# Patient Record
Sex: Male | Born: 1980 | Race: Black or African American | Hispanic: No | Marital: Married | Smoking: Current every day smoker
Health system: Southern US, Community
[De-identification: ages and names within clinical notes are randomized; demographics above are authoritative.]

---

## 2010-08-17 ENCOUNTER — Emergency Department (HOSPITAL_COMMUNITY)
Admission: EM | Admit: 2010-08-17 | Discharge: 2010-08-17 | Payer: Self-pay | Source: Home / Self Care | Admitting: Emergency Medicine

## 2010-08-17 ENCOUNTER — Emergency Department (HOSPITAL_COMMUNITY)
Admission: EM | Admit: 2010-08-17 | Discharge: 2010-08-18 | Payer: Self-pay | Source: Home / Self Care | Admitting: Emergency Medicine

## 2010-11-03 LAB — DIFFERENTIAL
Basophils Absolute: 0 10*3/uL (ref 0.0–0.1)
Eosinophils Relative: 3 % (ref 0–5)
Lymphs Abs: 1.4 10*3/uL (ref 0.7–4.0)
Monocytes Absolute: 0.9 10*3/uL (ref 0.1–1.0)
Neutro Abs: 3.9 10*3/uL (ref 1.7–7.7)

## 2010-11-03 LAB — BASIC METABOLIC PANEL
BUN: 9 mg/dL (ref 6–23)
CO2: 26 mEq/L (ref 19–32)
GFR calc Af Amer: >60 mL/min (ref >60)
Glucose, Bld: 100 mg/dL — ABNORMAL HIGH (ref 70–99)
Sodium: 136 mEq/L (ref 135–145)

## 2010-11-03 LAB — CBC
Hemoglobin: 13.7 g/dL (ref 13.0–17.0)
MCH: 33.2 pg (ref 26.0–34.0)
Platelets: 203 10*3/uL (ref 150–400)
RDW: 12 % (ref 11.5–15.5)
WBC: 6.4 10*3/uL (ref 4.0–10.5)

## 2011-08-10 ENCOUNTER — Emergency Department (HOSPITAL_COMMUNITY)
Admission: EM | Admit: 2011-08-10 | Discharge: 2011-08-10 | Disposition: A | Discharge status: Home / Self Care | Payer: Self-pay | Attending: Emergency Medicine | Admitting: Emergency Medicine

## 2011-08-10 ENCOUNTER — Encounter: Payer: Self-pay | Admitting: *Deleted

## 2011-08-10 ENCOUNTER — Emergency Department (HOSPITAL_COMMUNITY): Payer: Self-pay

## 2011-08-10 DIAGNOSIS — R509 Fever, unspecified: Secondary | ICD-10-CM | POA: Insufficient documentation

## 2011-08-10 DIAGNOSIS — M545 Low back pain, unspecified: Secondary | ICD-10-CM | POA: Insufficient documentation

## 2011-08-10 DIAGNOSIS — R51 Headache: Secondary | ICD-10-CM | POA: Insufficient documentation

## 2011-08-10 DIAGNOSIS — IMO0001 Reserved for inherently not codable concepts without codable children: Secondary | ICD-10-CM | POA: Insufficient documentation

## 2011-08-10 DIAGNOSIS — R6883 Chills (without fever): Secondary | ICD-10-CM | POA: Insufficient documentation

## 2011-08-10 DIAGNOSIS — J111 Influenza due to unidentified influenza virus with other respiratory manifestations: Secondary | ICD-10-CM | POA: Insufficient documentation

## 2011-08-10 DIAGNOSIS — R05 Cough: Secondary | ICD-10-CM | POA: Insufficient documentation

## 2011-08-10 DIAGNOSIS — H5789 Other specified disorders of eye and adnexa: Secondary | ICD-10-CM | POA: Insufficient documentation

## 2011-08-10 DIAGNOSIS — R059 Cough, unspecified: Secondary | ICD-10-CM | POA: Insufficient documentation

## 2011-08-10 DIAGNOSIS — M542 Cervicalgia: Secondary | ICD-10-CM | POA: Insufficient documentation

## 2011-08-10 MED ORDER — NAPROXEN 500 MG PO TABS: 500.0000 mg | ORAL_TABLET | Freq: Two times a day (BID) | ORAL | Status: AC

## 2011-08-10 NOTE — ED Notes (Signed)
Repots body aches, cough, chills, headache.  States symptoms started about 3 days ago.  Reports taking dayquil and nyquil for symptom relief.

## 2011-08-10 NOTE — ED Notes (Signed)
Received report agree with previous assessment 

## 2011-08-10 NOTE — ED Provider Notes (Signed)
This chart was scribed for Alan Jakes, MD by Wallis Mart. The patient was seen in room APA14/APA14 and the patient's care was started at 10:20 PM.    CSN: [id number] Arrival date & time: 08/10/2011  8:59 PM   First MD Initiated Contact with Patient 08/10/11 2105      Chief Complaint  Patient presents with  . Cough    (Consider location/radiation/quality/duration/timing/severity/associated sxs/prior treatment) Patient is a 30 y.o. male presenting with cough.  Cough This is a new problem. The current episode started more than 2 days ago. The problem occurs constantly. The problem has been gradually worsening. The cough is non-productive. Associated symptoms include chills, headaches, myalgias and eye redness. Pertinent negatives include no chest pain, no ear pain, no sore throat and no shortness of breath. He has tried cough syrup for the symptoms. The treatment provided no relief.   Pt seen at 10:20 PM SHAHIR KAREN is a 30 y.o. male who presents to the Emergency Department complaining of sudden onset, gradually worsening non-productive cough that began 3 days ago.  Pt c/o associated body aches, chills, headache, neck pain, lower back pain.  Pt says he woke up 5-6 hours ago and his body was in pain. Pt is positive for sick contact at home.  Pt self medicated with Nyquil and dayquil with no relief of symptoms.   Pt Denies n/v/d, sore throat, rash, dysuria, swelling in ankles.  History reviewed. No pertinent past medical history.  History reviewed. No pertinent past surgical history.  History reviewed. No pertinent family history.  History  Substance Use Topics  . Smoking status: Current Everyday Smoker -- 0.5 packs/day  . Smokeless tobacco: Not on file  . Alcohol Use: No      Review of Systems  Constitutional: Positive for chills.  HENT: Positive for neck pain. Negative for ear pain and sore throat.   Eyes: Positive for redness.  Respiratory: Positive for cough.  Negative for shortness of breath.   Cardiovascular: Negative for chest pain.  Musculoskeletal: Positive for myalgias and back pain.  Neurological: Positive for headaches.   10 Systems reviewed and are negative for acute change except as noted in the HPI.  Allergies  Amoxicillin  Home Medications   Current Outpatient Rx  Name Route Sig Dispense Refill  . NAPROXEN 500 MG PO TABS Oral Take 1 tablet (500 mg total) by mouth 2 (two) times daily. 14 tablet 0    BP 111/60  Pulse 93  Temp(Src) 99.1 F (37.3 C) (Oral)  Resp 24  Ht 5\' 7"  (1.702 m)  Wt 190 lb (86.183 kg)  BMI 29.76 kg/m2  SpO2 99%  Physical Exam  Nursing note and vitals reviewed. Constitutional: He is oriented to person, place, and time. He appears well-developed and well-nourished. No distress.  HENT:  Head: Normocephalic and atraumatic.       White coating on tongue  Eyes: EOM are normal. Pupils are equal, round, and reactive to light.       Redness in eyes (conjunctivits)  Neck: Neck supple. No tracheal deviation present.  Cardiovascular: Normal rate and normal heart sounds.   Pulmonary/Chest: Effort normal and breath sounds normal. No respiratory distress.  Abdominal: Soft. Bowel sounds are normal. He exhibits no distension. There is no tenderness.  Musculoskeletal: Normal range of motion. He exhibits no edema.  Neurological: He is alert and oriented to person, place, and time. No sensory deficit.  Skin: Skin is warm and dry.  Psychiatric: He has a normal  mood and affect. His behavior is normal.    ED Course  Procedures (including critical care time) DIAGNOSTIC STUDIES: Oxygen Saturation is 99% on room air, normal by my interpretation.    COORDINATION OF CARE:    Labs Reviewed - No data to display Dg Chest 2 View  08/10/2011  *RADIOLOGY REPORT*  Clinical Data: Cough, fever.  CHEST - 2 VIEW  Comparison: None.  Findings: Lungs are clear. No pleural effusion or pneumothorax. The cardiomediastinal  contours are within normal limits. The visualized bones and soft tissues are without significant appreciable abnormality.  IMPRESSION: No acute cardiopulmonary process.  Original Report Authenticated By: Waneta Martins, M.D.     1. Influenza       MDM  Patient's chest x-ray negative for pneumonia symptom complex is consistent with influenza she's been symptomatic for 3 days now. Patient is nontoxic in no acute distress will treat symptomatically.    I personally performed the services described in this documentation, which was scribed in my presence. The recorded information has been reviewed and considered.         Alan Jakes, MD 08/10/11 [phone]

## 2011-08-10 NOTE — ED Notes (Signed)
Dry cough, chills, body aches for 3 days. No NVD.

## 2011-08-10 NOTE — ED Notes (Signed)
Patient transported to X-ray 

## 2014-08-24 HISTORY — PX: HAND SURGERY: SHX662

## 2016-07-24 ENCOUNTER — Other Ambulatory Visit: Payer: Self-pay | Admitting: Occupational Medicine

## 2016-07-24 ENCOUNTER — Ambulatory Visit: Payer: Self-pay

## 2016-07-24 DIAGNOSIS — M79644 Pain in right finger(s): Secondary | ICD-10-CM

## 2016-07-29 ENCOUNTER — Encounter (HOSPITAL_COMMUNITY): Payer: Self-pay | Admitting: *Deleted

## 2016-07-29 ENCOUNTER — Emergency Department (HOSPITAL_COMMUNITY)
Admission: EM | Admit: 2016-07-29 | Discharge: 2016-07-29 | Disposition: A | Discharge status: Home / Self Care | Payer: Worker's Compensation | Attending: Emergency Medicine | Admitting: Emergency Medicine

## 2016-07-29 DIAGNOSIS — Y9289 Other specified places as the place of occurrence of the external cause: Secondary | ICD-10-CM | POA: Insufficient documentation

## 2016-07-29 DIAGNOSIS — Y9389 Activity, other specified: Secondary | ICD-10-CM | POA: Insufficient documentation

## 2016-07-29 DIAGNOSIS — Y990 Civilian activity done for income or pay: Secondary | ICD-10-CM | POA: Insufficient documentation

## 2016-07-29 DIAGNOSIS — F17200 Nicotine dependence, unspecified, uncomplicated: Secondary | ICD-10-CM | POA: Diagnosis not present

## 2016-07-29 DIAGNOSIS — S61213A Laceration without foreign body of left middle finger without damage to nail, initial encounter: Secondary | ICD-10-CM

## 2016-07-29 DIAGNOSIS — W3189XA Contact with other specified machinery, initial encounter: Secondary | ICD-10-CM | POA: Insufficient documentation

## 2016-07-29 NOTE — Progress Notes (Signed)
Orthopedic Tech Progress Note Patient Details:  Alan James [birth year] [id number]  Ortho Devices Type of Ortho Device: Finger splint Ortho Device/Splint Location: LUE 3 RD finger Ortho Device/Splint Interventions: Ordered, Application   Jennye MoccasinHughes, Jafar Poffenberger Craig 07/29/2016, 5:27 PM

## 2016-07-29 NOTE — ED Provider Notes (Signed)
[facility]-EMERGENCY DEPT Provider Note   CSN: [id number] Arrival date & time: 07/29/16  1552  By signing my name below, I, Alan James, attest that this documentation has been prepared under the direction and in the presence of Alan BuffaloHope Neese, NP. Electronically Signed: Angelene GiovanniEmmanuella James, ED Scribe. 07/29/16. 4:52 PM.   History   Chief Complaint Chief Complaint  Patient presents with  . Finger Injury    HPI Comments: Alan James is a 35 y.o. male who presents to the Emergency Department complaining of a laceration to his left middle finger that occurred PTA while at work. He explains that he was working on a machine when his finger slipped and he lacerated his finger, sustaining an avulsion. Bleeding is currently controlled with an applied gauze and quick clot [facility] at [facility]. He states that [facility], he was advised to come here for further evaluation. No oral medications noted. Pt has an allergy to Amoxicillin. He reports that his last tetanus vaccine was in 2012. He denies any fever, chills, vomiting, numbness, weakness, or any other symptoms.   The history is provided by the patient. No language interpreter was used.    History reviewed. No pertinent past medical history.  There are no active problems to display for this patient.   History reviewed. No pertinent surgical history.     Home Medications    Prior to Admission medications   Not on File    Family History History reviewed. No pertinent family history.  Social History Social History  Substance Use Topics  . Smoking status: Current Every Day Smoker    Packs/day: 0.50  . Smokeless tobacco: Not on file  . Alcohol use No     Allergies   Amoxicillin   Review of Systems Review of Systems  Constitutional: Negative for chills and fever.  Gastrointestinal: Negative for vomiting.  Skin: Positive for wound.  Neurological: Negative for  weakness and numbness.    Physical Exam Updated Vital Signs BP 133/81 (BP Location: Right Arm)   Pulse 77   Temp 98.2 F (36.8 C) (Oral)   Resp 18   SpO2 98%   Physical Exam  Constitutional: He is oriented to person, place, and time. He appears well-developed and well-nourished. No distress.  HENT:  Head: Normocephalic and atraumatic.  Eyes: Conjunctivae and EOM are normal.  Neck: Neck supple. No tracheal deviation present.  Cardiovascular: Normal rate.   Pulmonary/Chest: Effort normal. No respiratory distress.  Musculoskeletal: Normal range of motion.  Wound to left middle finger, ulnar aspect that is an avulsion; bleeding has stopped after quick clot [facility]  Neurological: He is alert and oriented to person, place, and time.  Skin: Skin is warm and dry.  Psychiatric: He has a normal mood and affect. His behavior is normal.  Nursing note and vitals reviewed.    ED Treatments / Results  DIAGNOSTIC STUDIES: Oxygen Saturation is 98% on RA, normal by my interpretation.    COORDINATION OF CARE: 4:47 PM- Pt advised of plan for treatment and pt agrees. Pt will receive dressing.    Labs (all labs ordered are listed, but only abnormal results are displayed) Labs Reviewed - No data to display   Radiology No results found.  Procedures Procedures (including critical care time)  Medications Ordered in ED Medications - No data to display   Initial Impression / Assessment and Plan / ED Course  Alan BuffaloHope Neese, NP has  reviewed the triage vital signs and the nursing notes.  Clinical Course     Tetanus vaccine UTD. Pt has no co morbidities to effect normal wound healing. Discussed wound care  with pt and answered questions. Pt to f-u for any signs of infection or if bleeding starts back and he can not stop it. Nonstick dressing and splint applied.  Pt is hemodynamically stable w no complaints prior to dc.    Final Clinical Impressions(s) / ED Diagnoses    Final diagnoses:  Laceration of left middle finger without foreign body without damage to nail, initial encounter    New Prescriptions New Prescriptions   No medications on file   I personally performed the services described in this documentation, which was scribed in my presence. The recorded information has been reviewed and is accurate.    Alan ParkHope M Neese, NP 07/29/16 1710    Alan ConnPedro Eduardo Cardama, MD 07/30/16 [phone]

## 2016-07-29 NOTE — ED Notes (Signed)
Papers reviewed and splint applied by ortho tech

## 2016-07-29 NOTE — ED Triage Notes (Signed)
Pt reports injuring left middle finger at work. Went to [facility] but was sent here for further eval. Has laceration noted to lateral finger but bleeding is controlled.

## 2016-07-29 NOTE — Discharge Instructions (Signed)
Wear a dressing and splint while working. You can leave the wound open at home. If you develop signs of infection, redness,swelling, increased pain, red streaking or bleeding starts back and you can't get it stopped return here.

## 2018-06-02 ENCOUNTER — Emergency Department
Admission: EM | Admit: 2018-06-02 | Discharge: 2018-06-02 | Disposition: A | Discharge status: Home / Self Care | Payer: Self-pay | Attending: Emergency Medicine | Admitting: Emergency Medicine

## 2018-06-02 DIAGNOSIS — Y929 Unspecified place or not applicable: Secondary | ICD-10-CM | POA: Insufficient documentation

## 2018-06-02 DIAGNOSIS — S46911A Strain of unspecified muscle, fascia and tendon at shoulder and upper arm level, right arm, initial encounter: Secondary | ICD-10-CM | POA: Insufficient documentation

## 2018-06-02 DIAGNOSIS — F17200 Nicotine dependence, unspecified, uncomplicated: Secondary | ICD-10-CM | POA: Insufficient documentation

## 2018-06-02 DIAGNOSIS — X500XXA Overexertion from strenuous movement or load, initial encounter: Secondary | ICD-10-CM | POA: Insufficient documentation

## 2018-06-02 DIAGNOSIS — Y990 Civilian activity done for income or pay: Secondary | ICD-10-CM | POA: Insufficient documentation

## 2018-06-02 DIAGNOSIS — Y93E6 Activity, residential relocation: Secondary | ICD-10-CM | POA: Insufficient documentation

## 2018-06-02 DIAGNOSIS — S46811A Strain of other muscles, fascia and tendons at shoulder and upper arm level, right arm, initial encounter: Secondary | ICD-10-CM | POA: Insufficient documentation

## 2018-06-02 MED ORDER — KETOROLAC TROMETHAMINE 30 MG/ML IJ SOLN
30.0000 mg | Freq: Once | INTRAMUSCULAR | Status: AC
  Administered 2018-06-02: 30 mg via INTRAMUSCULAR
  Filled 2018-06-02: qty 1

## 2018-06-02 MED ORDER — MELOXICAM 15 MG PO TABS: 15.0000 mg | ORAL_TABLET | Freq: Every day | ORAL | 0 refills | Status: DC

## 2018-06-02 NOTE — ED Provider Notes (Signed)
[facility] Emergency Department Provider Note  ____________________________________________  Time seen: Approximately 10:11 AM  I have reviewed the triage vital signs and the nursing notes.   HISTORY  Chief Complaint Shoulder Pain    HPI Alan James is a 37 y.o. male who presents the emergency department complaining of right shoulder pain.  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  Patient reports that he woke up with an ache in the right posterior and anterior shoulder.  Patient denies any specific injury yesterday.  He denies any radicular symptoms in the right upper extremity.  Patient denies any neck pain or chest pain.  Patient is not tried medications for this complaint prior to arrival.  No other complaints at this time.    History reviewed. No pertinent past medical history.  There are no active problems to display for this patient.   History reviewed. No pertinent surgical history.  Prior to Admission medications   Medication Sig Start Date End Date Taking? Authorizing Provider  meloxicam (MOBIC) 15 MG tablet Take 1 tablet (15 mg total) by mouth daily. 06/02/18   Jeilyn Reznik, Delorise Royals, PA-C    Allergies Amoxicillin  No family history on file.  Social History Social History   Tobacco Use  . Smoking status: Current Every Day Smoker    Packs/day: 0.50  . Smokeless tobacco: Never Used  Substance Use Topics  . Alcohol use: Yes    Comment: ocassional  . Drug use: Not on file     Review of Systems  Constitutional: No fever/chills Eyes: No visual changes. ENT: No upper respiratory complaints. Cardiovascular: no chest pain. Respiratory: no cough. No SOB. Gastrointestinal: No abdominal pain.  No nausea, no vomiting.   Musculoskeletal: Positive for right shoulder pain Skin: Negative for rash, abrasions, lacerations, ecchymosis. Neurological: Negative for headaches, focal  weakness or numbness. 10-point ROS otherwise negative.  ____________________________________________   PHYSICAL EXAM:  VITAL SIGNS: ED Triage Vitals  Enc Vitals Group     BP 06/02/18 1000 123/76     Pulse Rate 06/02/18 1000 62     Resp 06/02/18 1000 16     Temp 06/02/18 1000 98.1 F (36.7 C)     Temp Source 06/02/18 1000 Oral     SpO2 06/02/18 1000 98 %     Weight 06/02/18 0958 190 lb (86.2 kg)     Height 06/02/18 0958 5\' 7"  (1.702 m)     Head Circumference --      Peak Flow --      Pain Score 06/02/18 0958 7     Pain Loc --      Pain Edu? --      Excl. in GC? --      Constitutional: Alert and oriented. Well appearing and in no acute distress. Eyes: Conjunctivae are normal. PERRL. EOMI. Head: Atraumatic. Neck: No stridor.  No cervical spine tenderness to palpation.  Cardiovascular: Normal rate, regular rhythm. Normal S1 and S2.  Good peripheral circulation. Respiratory: Normal respiratory effort without tachypnea or retractions. Lungs CTAB. Good air entry to the bases with no decreased or absent breath sounds. Musculoskeletal: Full range of motion to all extremities. No gross deformities appreciated.  Visualization of the right shoulder reveals no ecchymosis, deformity, edema.  Patient has full range of motion.  Negative Neer's.  Negative drop test.  Patient is tender to palpation along the trapezius muscle as well as the posterior rotator cuff and acromioclavicular joint space.  No palpable abnormality or deficit.  No other tenderness to palpation.  Radial pulse intact distally.  Sensation intact x5 digits distally. Neurologic:  Normal speech and language. No gross focal neurologic deficits are appreciated.  Skin:  Skin is warm, dry and intact. No rash noted. Psychiatric: Mood and affect are normal. Speech and behavior are normal. Patient exhibits appropriate insight and judgement.   ____________________________________________   LABS (all labs ordered are listed, but  only abnormal results are displayed)  Labs Reviewed - No data to display ____________________________________________  EKG   ____________________________________________  RADIOLOGY   No results found.  ____________________________________________    PROCEDURES  Procedure(s) performed:    Procedures    Medications  ketorolac (TORADOL) 30 MG/ML injection 30 mg (has no administration in time range)     ____________________________________________   INITIAL IMPRESSION / ASSESSMENT AND PLAN / ED COURSE  Pertinent labs & imaging results that were available during my care of the patient were reviewed by me and considered in my medical decision making (see chart for details).  Review of the [place] CSRS was performed in accordance of the [facility] prior to dispensing any controlled drugs.      Patient's diagnosis is consistent with shoulder strain, trapezius muscle strain.  Patient presents the emergency department with shoulder pain after lifting increased weight yesterday for his job.  No indication of acute muscle tear or ligament rupture.  No indication for imaging at this time.  Patient was given Toradol injection in the emergency department for symptom relief. Patient will be discharged home with prescriptions for meloxicam. Patient is to follow up with primary care or orthopedics as needed or otherwise directed. Patient is given ED precautions to return to the ED for any worsening or new symptoms.     ____________________________________________  FINAL CLINICAL IMPRESSION(S) / ED DIAGNOSES  Final diagnoses:  Strain of right shoulder, initial encounter  Strain of right trapezius muscle, initial encounter      NEW MEDICATIONS STARTED DURING THIS VISIT:  ED Discharge Orders         Ordered    meloxicam (MOBIC) 15 MG tablet  Daily     06/02/18 1032              This chart was dictated using voice recognition software/Dragon. Despite best efforts to  proofread, errors can occur which can change the meaning. Any change was purely unintentional.    Racheal Patches, PA-C 06/02/18 1033    Dionne Bucy, MD 06/02/18 [phone]

## 2018-06-02 NOTE — ED Notes (Signed)
See triage note  Presents with pain to right shoulder  States pain radiates from neck into shoulder no deformity noted  Denies any specific injury but does do a lot of heavy lifting  Good pulses

## 2018-06-02 NOTE — ED Triage Notes (Signed)
Pt came to ED via pov c/o right shoulder pain. Pt reports did a lot of heavy lifting yesterday and woke up today with pain that radiates up towards neck

## 2018-09-04 ENCOUNTER — Other Ambulatory Visit: Payer: Self-pay

## 2018-09-04 ENCOUNTER — Emergency Department
Admission: EM | Admit: 2018-09-04 | Discharge: 2018-09-04 | Disposition: A | Discharge status: Home / Self Care | Payer: Self-pay | Attending: Emergency Medicine | Admitting: Emergency Medicine

## 2018-09-04 DIAGNOSIS — K047 Periapical abscess without sinus: Secondary | ICD-10-CM | POA: Insufficient documentation

## 2018-09-04 DIAGNOSIS — Z79899 Other long term (current) drug therapy: Secondary | ICD-10-CM | POA: Insufficient documentation

## 2018-09-04 DIAGNOSIS — F17210 Nicotine dependence, cigarettes, uncomplicated: Secondary | ICD-10-CM | POA: Insufficient documentation

## 2018-09-04 MED ORDER — OXYCODONE-ACETAMINOPHEN 5-325 MG PO TABS
1.0000 | ORAL_TABLET | Freq: Once | ORAL | Status: AC
  Administered 2018-09-04: 1 via ORAL
  Filled 2018-09-04: qty 1

## 2018-09-04 MED ORDER — CLINDAMYCIN PHOSPHATE 600 MG/4ML IJ SOLN
600.0000 mg | Freq: Once | INTRAMUSCULAR | Status: AC
  Administered 2018-09-04: 600 mg via INTRAMUSCULAR
  Filled 2018-09-04: qty 4

## 2018-09-04 MED ORDER — LIDOCAINE VISCOUS HCL 2 % MT SOLN: 10.0000 mL | OROMUCOSAL | 0 refills | Status: DC | PRN

## 2018-09-04 MED ORDER — CLINDAMYCIN HCL 300 MG PO CAPS: 300.0000 mg | ORAL_CAPSULE | Freq: Three times a day (TID) | ORAL | 0 refills | Status: AC

## 2018-09-04 MED ORDER — DEXAMETHASONE SODIUM PHOSPHATE 10 MG/ML IJ SOLN
10.0000 mg | Freq: Once | INTRAMUSCULAR | Status: AC
  Administered 2018-09-04: 10 mg via INTRAMUSCULAR
  Filled 2018-09-04: qty 1

## 2018-09-04 NOTE — Discharge Instructions (Signed)
OPTIONS FOR DENTAL FOLLOW UP CARE ° °[place] Department of Health and Human Services - Local Safety Net Dental Clinics °http://www.ncdhhs.gov/dph/oralhealth/services/safetynetclinics.htm °  °[facility] (336-562-3123) ° °[facility] [place] (919-933-9087) ° °[facility] ([phone] ext [phone]) ° °[facility] ([phone]) ° °[facility] ([phone]) °This clinic caters to the indigent population and is on a lottery system. °Location: °[facility], [address], [place] °Clinic Hours: °Wednesdays from 6pm - 9pm, patients seen by a lottery system. °For dates, call or go to www.med.[facility].edu/[facility]/patients/Dental-[facility] °Services: °Cleanings, fillings and simple extractions. °Payment Options: °DENTAL WORK IS FREE OF CHARGE. Bring proof of income or support. °Best way to get seen: °Arrive at 5:15 pm - this is a lottery, NOT first come/first serve, so arriving earlier will not increase your chances of being seen. °  °  °[facility] °[phone] °Select option 1 for emergencies °  °Location: °[facility], [address], [place] °Clinic Hours: °No walk-ins accepted - call the day before to schedule an appointment. °Check in times are 9:30 am and 1:30 pm. °Services: °Simple extractions, temporary fillings, pulpectomy/pulp debridement, uncomplicated abscess drainage. °Payment Options: °PAYMENT IS DUE AT THE TIME OF SERVICE.  Fee is usually $100-200, additional surgical procedures (e.g. abscess drainage) may be extra. °Cash, checks, Visa/MasterCard accepted.  Can file Medicaid if patient is covered for dental - patient should call case worker to check. °No discount for [facility] patients. °Best way to get seen: °MUST call the day before and get onto the schedule. Can usually be seen the next 1-2 days. No walk-ins accepted. °  °  °[facility] °[phone] °   °Location: °[facility], [address], [place] °Clinic Hours: °M, W, Th, F 8am or 1:30pm, Tues 9a or 1:30 - first come/first served. °Services: °Simple extractions, temporary fillings, uncomplicated abscess drainage.  You do not need to be an [place] resident. °Payment Options: °PAYMENT IS DUE AT THE TIME OF SERVICE. °Dental insurance, otherwise sliding scale - bring proof of income or support. °Depending on income and treatment needed, cost is usually $50-200. °Best way to get seen: °Arrive early as it is first come/first served. °  °  °[facility] °[phone] °  °Location: °[address] °Clinic Hours: °Mon-Thu 8a-5p °Services: °Most basic dental services including extractions and fillings. °Payment Options: °PAYMENT IS DUE AT THE TIME OF SERVICE. °Sliding scale, up to 50% off - bring proof if income or support. °Medicaid with dental option accepted. °Best way to get seen: °Call to schedule an appointment, can usually be seen within 2 weeks OR they will try to see walk-ins - show up at 8a or 2p (you may have to wait). °  °  °[facility] °919-245-2435 °[place] RESIDENTS ONLY °  °Location: °[facility], [address], [place], [place] [address] °Clinic Hours: By appointment only. °Monday - Thursday 8am-5pm, Friday 8am-12pm °Services: Cleanings, fillings, extractions. °Payment Options: °PAYMENT IS DUE AT THE TIME OF SERVICE. °Cash, Visa or MasterCard. Sliding scale - $30 minimum per service. °Best way to get seen: °Come in to office, complete packet and make an appointment - need proof of income °or support monies for each household member and proof of [place] residence. °Usually takes about a month to get in. °  °  °[facility] °[phone] °  °Location: °[address]   [place] °Clinic Hours: Walk-in Urgent Care Dental Services are offered Monday-Friday  mornings only. °The numbers of emergencies accepted daily is limited to the number of °providers available. °Maximum 15 - Mondays, Wednesdays & Thursdays °Maximum 10 - Tuesdays & Fridays °Services: °You do not need to be a [place] resident to be seen for a dental emergency. °Emergencies are defined as pain, swelling, abnormal bleeding, or dental trauma. Walkins will receive x-rays if needed. °NOTE: Dental cleaning is not an emergency. °Payment Options: °PAYMENT IS DUE AT THE TIME OF SERVICE. °Minimum co-pay is $40.00 for uninsured patients. °Minimum co-pay is $3.00 for Medicaid with dental coverage. °Dental Insurance is accepted and must be presented at time of visit. °Medicare does not cover dental. °Forms of payment: Cash, credit card, checks. °Best way to get seen: °If not previously registered with the clinic, walk-in dental registration begins at 7:15 am and is on a first come/first serve basis. °If previously registered with the clinic, call to make an appointment. °  °  °[facility] °[phone] °[place] RESIDENTS ONLY °  °Location: °[address], [place], [place] °Clinic Hours: °Mon-Thu 10a-2p °Services: Extractions only! °Payment Options: °FREE (donations accepted) - bring proof of income or support °Best way to get seen: °Call and schedule an appointment OR come at 8am on the 1st Monday of every month (except for holidays) when it is first come/first served. °  °  °[facility] °[phone] °  °Location: °[address], [place] °Clinic Hours: °Friday mornings °Services, Payment Options, Best way to get seen: °Call for info °

## 2018-09-04 NOTE — ED Triage Notes (Signed)
Pt c/o left lower tooth pain - crown fell off Wednesday and the pain has increasingly gotten worse

## 2018-09-04 NOTE — ED Provider Notes (Signed)
[facility] Emergency Department Provider Note  ____________________________________________  Time seen: Approximately 3:26 PM  I have reviewed the triage vital signs and the nursing notes.   HISTORY  Chief Complaint Dental Pain    HPI Alan James is a 38 y.o. male presents emergency department for evaluation of left-sided dental pain since yesterday.  Patient states that his crown fell off of his tooth earlier this week.  Cheek started to swell yesterday but worsened today.  He recently moved from [place] and does not have a dentist here.  He has been taking Tylenol and ibuprofen for pain.  No fever.   History reviewed. No pertinent past medical history.  There are no active problems to display for this patient.   History reviewed. No pertinent surgical history.  Prior to Admission medications   Medication Sig Start Date End Date Taking? Authorizing Provider  clindamycin (CLEOCIN) 300 MG capsule Take 1 capsule (300 mg total) by mouth 3 (three) times daily for 10 days. 09/04/18 09/14/18  Enid Derry, PA-C  lidocaine (XYLOCAINE) 2 % solution Use as directed 10 mLs in the mouth or throat as needed for mouth pain. 09/04/18   Enid Derry, PA-C  meloxicam (MOBIC) 15 MG tablet Take 1 tablet (15 mg total) by mouth daily. 06/02/18   Cuthriell, Delorise Royals, PA-C    Allergies Amoxicillin  No family history on file.  Social History Social History   Tobacco Use  . Smoking status: Current Every Day Smoker    Packs/day: 0.50    Types: Cigarettes  . Smokeless tobacco: Never Used  Substance Use Topics  . Alcohol use: Yes    Comment: ocassional  . Drug use: Never     Review of Systems  Cardiovascular: No chest pain. Respiratory: No SOB. Gastrointestinal:.  No nausea, no vomiting.  Musculoskeletal: Negative for musculoskeletal pain. Skin: Negative for rash, abrasions, lacerations,  ecchymosis.   ____________________________________________   PHYSICAL EXAM:  VITAL SIGNS: ED Triage Vitals  Enc Vitals Group     BP 09/04/18 1430 132/83     Pulse Rate 09/04/18 1430 83     Resp 09/04/18 1430 16     Temp 09/04/18 1430 98.3 F (36.8 C)     Temp Source 09/04/18 1430 Oral     SpO2 09/04/18 1430 97 %     Weight 09/04/18 1431 185 lb (83.9 kg)     Height 09/04/18 1431 5\' 7"  (1.702 m)     Head Circumference --      Peak Flow --      Pain Score 09/04/18 1430 8     Pain Loc --      Pain Edu? --      Excl. in GC? --      Constitutional: Alert and oriented. Well appearing and in no acute distress. Eyes: Conjunctivae are normal. PERRL. EOMI. Head: Atraumatic. ENT:      Ears:      Nose: No congestion/rhinnorhea.      Mouth/Throat: Mucous membranes are moist.  Moderate swelling to left cheek.  Large cavity to left bottom premolar.  No palpable abscess.  No difficulty opening or closing mouth. Neck: No stridor. Cardiovascular: Normal rate, regular rhythm.  Good peripheral circulation. Respiratory: Normal respiratory effort without tachypnea or retractions. Lungs CTAB. Good air entry to the bases with no decreased or absent breath sounds. Musculoskeletal: Full range of motion to all extremities. No gross deformities appreciated. Neurologic:  Normal speech and language. No gross focal neurologic deficits are  appreciated.  Skin:  Skin is warm, dry and intact. No rash noted. Psychiatric: Mood and affect are normal. Speech and behavior are normal. Patient exhibits appropriate insight and judgement.   ____________________________________________   LABS (all labs ordered are listed, but only abnormal results are displayed)  Labs Reviewed - No data to display ____________________________________________  EKG   ____________________________________________  RADIOLOGY   No results  found.  ____________________________________________    PROCEDURES  Procedure(s) performed:    Procedures    Medications  dexamethasone (DECADRON) injection 10 mg (10 mg Intramuscular Given 09/04/18 1537)  clindamycin (CLEOCIN) injection 600 mg (600 mg Intramuscular Given 09/04/18 1537)  oxyCODONE-acetaminophen (PERCOCET/ROXICET) 5-325 MG per tablet 1 tablet (1 tablet Oral Given 09/04/18 1536)     ____________________________________________   INITIAL IMPRESSION / ASSESSMENT AND PLAN / ED COURSE  Pertinent labs & imaging results that were available during my care of the patient were reviewed by me and considered in my medical decision making (see chart for details).  Review of the [place] CSRS was performed in accordance of the [facility] prior to dispensing any controlled drugs.     Patient's diagnosis is consistent with dental abscess.  Dental resources were provided.  Patient is allergic to amoxicillin.  Patient will be discharged home with prescriptions for clindamycin and viscous lidocaine.  Patient is to follow up with dentist as directed. Patient is given ED precautions to return to the ED for any worsening or new symptoms.     ____________________________________________  FINAL CLINICAL IMPRESSION(S) / ED DIAGNOSES  Final diagnoses:  Dental abscess      NEW MEDICATIONS STARTED DURING THIS VISIT:  ED Discharge Orders         Ordered    clindamycin (CLEOCIN) 300 MG capsule  3 times daily     09/04/18 1528    lidocaine (XYLOCAINE) 2 % solution  As needed     09/04/18 1528              This chart was dictated using voice recognition software/Dragon. Despite best efforts to proofread, errors can occur which can change the meaning. Any change was purely unintentional.    Enid DerryWagner, Chelsae Zanella, PA-C 09/04/18 1551    Rockne MenghiniNorman, Anne-Caroline, MD 09/05/18 [phone]

## 2018-09-04 NOTE — ED Notes (Signed)
Pt reports left facial swelling worse since yesterday, states lost crown of tooth on the left earlier in the week, pt recently relocated to [facility] from [place], needs dental referral for this area.

## 2018-11-03 ENCOUNTER — Emergency Department
Admission: EM | Admit: 2018-11-03 | Discharge: 2018-11-03 | Disposition: A | Discharge status: Home / Self Care | Payer: Self-pay | Attending: Emergency Medicine | Admitting: Emergency Medicine

## 2018-11-03 ENCOUNTER — Encounter: Payer: Self-pay | Admitting: Emergency Medicine

## 2018-11-03 ENCOUNTER — Other Ambulatory Visit: Payer: Self-pay

## 2018-11-03 DIAGNOSIS — K047 Periapical abscess without sinus: Secondary | ICD-10-CM | POA: Insufficient documentation

## 2018-11-03 MED ORDER — HYDROCODONE-ACETAMINOPHEN 5-325 MG PO TABS
1.0000 | ORAL_TABLET | Freq: Once | ORAL | Status: AC
  Administered 2018-11-03: 1 via ORAL
  Filled 2018-11-03: qty 1

## 2018-11-03 MED ORDER — TRAMADOL HCL 50 MG PO TABS: 50.0000 mg | ORAL_TABLET | Freq: Three times a day (TID) | ORAL | 0 refills | Status: AC | PRN

## 2018-11-03 MED ORDER — CLINDAMYCIN HCL 150 MG PO CAPS
ORAL_CAPSULE | ORAL | Status: AC
  Filled 2018-11-03: qty 2

## 2018-11-03 MED ORDER — CLINDAMYCIN HCL 300 MG PO CAPS: 300.0000 mg | ORAL_CAPSULE | Freq: Three times a day (TID) | ORAL | 0 refills | Status: AC

## 2018-11-03 MED ORDER — LIDOCAINE-EPINEPHRINE 2 %-1:100000 IJ SOLN
INTRAMUSCULAR | Status: AC
  Filled 2018-11-03: qty 1.7

## 2018-11-03 NOTE — Discharge Instructions (Addendum)
You are being treated for a dental abscess. Take the antibiotic as directed. Apply compresses to the face to promote healing and pain relief. Follow-up with your dental provider for definitive treatment or referral to a oral-maxillofacial surgeon. Return to the ED as needed.

## 2018-11-03 NOTE — ED Provider Notes (Signed)
[facility] Emergency Department Provider Note ____________________________________________  Time seen: 0945  I have reviewed the triage vital signs and the nursing notes.  HISTORY  Chief Complaint  Dental Pain  HPI Alan James is a 38 y.o. male presents to the ED accompanied by his significant other, for evaluation of right lower jaw pain.  She describes he has had intermittent pain to the area since a failed root canal back in December.  According to the patient, the dentist admittedly lodged a piece of the drill bit in the patient's root/socket.  The dental provider is in the process of coordinating with the maxillofacial surgeon for removal of the retained foreign body.  Patient reports intermittent pain to the same area.  He describes over the last 3 to 4 days has had increased pain and swelling to the right lower jaw.  He denies any fevers, chills, or sweats.  He denies any spontaneous drainage.  History reviewed. No pertinent past medical history.  There are no active problems to display for this patient.  History reviewed. No pertinent surgical history.  Prior to Admission medications   Medication Sig Start Date End Date Taking? Authorizing Provider  clindamycin (CLEOCIN) 300 MG capsule Take 1 capsule (300 mg total) by mouth 3 (three) times daily for 10 days. 11/03/18 11/13/18  Garrell Flagg, Charlesetta Ivory, PA-C  traMADol (ULTRAM) 50 MG tablet Take 1 tablet (50 mg total) by mouth 3 (three) times daily as needed for up to 3 days. 11/03/18 11/06/18  Emmitte Surgeon, Charlesetta Ivory, PA-C    Allergies Amoxicillin  No family history on file.  Social History Social History   Tobacco Use  . Smoking status: Current Every Day Smoker    Packs/day: 0.50    Types: Cigarettes  . Smokeless tobacco: Never Used  Substance Use Topics  . Alcohol use: Yes    Comment: ocassional  . Drug use: Never    Review of Systems  Constitutional: Negative for fever. Eyes: Negative  for visual changes. ENT: Negative for sore throat.  Right lower jaw pain as above. Cardiovascular: Negative for chest pain. Respiratory: Negative for shortness of breath. Gastrointestinal: Negative for abdominal pain, vomiting and diarrhea. Genitourinary: Negative for dysuria. Musculoskeletal: Negative for back pain. Skin: Negative for rash. Neurological: Negative for headaches, focal weakness or numbness. ____________________________________________  PHYSICAL EXAM:  VITAL SIGNS: ED Triage Vitals  Enc Vitals Group     BP --      Pulse --      Resp --      Temp --      Temp src --      SpO2 --      Weight 11/03/18 1030 180 lb (81.6 kg)     Height 11/03/18 1030 5\' 7"  (1.702 m)     Head Circumference --      Peak Flow --      Pain Score 11/03/18 1029 9     Pain Loc --      Pain Edu? --      Excl. in GC? --     Constitutional: Alert and oriented. Well appearing and in no distress. Head: Normocephalic and atraumatic. Eyes: Conjunctivae are normal. Normal extraocular movements Ears: Canals clear. TMs intact bilaterally. Mouth/Throat: Mucous membranes are moist.  Uvula is midline and tonsils are flat.  No oropharyngeal lesions are appreciated.  Patient with a chronically broken right lower first molar.  There is no significant gum swelling appreciated.  There is some fullness to  the right lateral jaw.  No brawny sublingual edema is appreciated. Neck: Supple. No thyromegaly. Hematological/Lymphatic/Immunological: No cervical lymphadenopathy. Cardiovascular: Normal rate, regular rhythm. Normal distal pulses. Respiratory: Normal respiratory effort. No wheezes/rales/rhonchi. ____________________________________________  PROCEDURES  Clindamycin 300 mg PO Norco 5-325 mg PO  Procedures DENTAL BLOCK  Performed by: Lissa Hoard Consent: Verbal consent obtained. Required items: devices and special equipment available Time out: Immediately prior to procedure a "time out"  was called to verify the correct patient, procedure, equipment, support staff and site/side marked as required.  Indication: pain Nerve block body site: right lower 2nd molar  Preparation: Patient was prepped and draped in the usual sterile fashion. Needle gauge: 27 G Location technique: anatomical landmarks  Local anesthetic: lido-epi 2%-1:100000  Anesthetic total: 1.7 ml  Outcome: pain improved Patient tolerance: Patient tolerated the procedure well with no immediate complications. ____________________________________________  INITIAL IMPRESSION / ASSESSMENT AND PLAN / ED COURSE  Patient with ED evaluation of right lower dental pain.  Patient presents with a parent retained metallic foreign body to the right lower jaw secondary to a failed dental procedure.  Patient's clinical picture is consistent with a focal dental abscess.  He is treated empirically with clindamycin and is given tramadol for pain relief.  He reports improvement with a local dental block.  He is referred back to his dental provider for definitive management and referral to an oral maxillofacial surgeon.  Return precautions have been reviewed.  I reviewed the patient's prescription history over the last 12 months in the multi-state controlled substances database(s) that includes [place], [place], [place], [place], [place], [place], [place], [place], [place], [place], [place], [place], [place], and [place].  Results were notable for no narcotic history. ____________________________________________  FINAL CLINICAL IMPRESSION(S) / ED DIAGNOSES  Final diagnoses:  Dental abscess      Lissa Hoard, PA-C 11/03/18 1036    Phineas Semen, MD 11/03/18 1121

## 2018-11-03 NOTE — ED Triage Notes (Signed)
Triage on paper  Dental pain

## 2019-02-03 ENCOUNTER — Encounter: Payer: Self-pay | Admitting: Emergency Medicine

## 2019-02-03 ENCOUNTER — Emergency Department
Admission: EM | Admit: 2019-02-03 | Discharge: 2019-02-03 | Disposition: A | Discharge status: Home / Self Care | Payer: Self-pay | Attending: Emergency Medicine | Admitting: Emergency Medicine

## 2019-02-03 ENCOUNTER — Other Ambulatory Visit: Payer: Self-pay

## 2019-02-03 ENCOUNTER — Emergency Department: Payer: Self-pay

## 2019-02-03 DIAGNOSIS — K029 Dental caries, unspecified: Secondary | ICD-10-CM | POA: Insufficient documentation

## 2019-02-03 DIAGNOSIS — F17210 Nicotine dependence, cigarettes, uncomplicated: Secondary | ICD-10-CM | POA: Insufficient documentation

## 2019-02-03 DIAGNOSIS — T81509A Unspecified complication of foreign body accidentally left in body following unspecified procedure, initial encounter: Secondary | ICD-10-CM | POA: Insufficient documentation

## 2019-02-03 DIAGNOSIS — Y658 Other specified misadventures during surgical and medical care: Secondary | ICD-10-CM | POA: Insufficient documentation

## 2019-02-03 MED ORDER — LIDOCAINE VISCOUS HCL 2 % MT SOLN: 5.0000 mL | Freq: Four times a day (QID) | OROMUCOSAL | 0 refills | Status: DC | PRN

## 2019-02-03 MED ORDER — TRAMADOL HCL 50 MG PO TABS: 50.0000 mg | ORAL_TABLET | Freq: Two times a day (BID) | ORAL | 0 refills | Status: DC | PRN

## 2019-02-03 NOTE — ED Provider Notes (Signed)
[facility] Emergency Department Provider Note   ____________________________________________   First MD Initiated Contact with Patient 02/03/19 [phone]     (approximate)  I have reviewed the triage vital signs and the nursing notes.   HISTORY  Chief Complaint Dental Pain    HPI Alan James is a 38 y.o. male    Patient complain of left molar pain secondary to foreign body.  Patient states 6 months ago he had a root canal and a drill bit broke off into the tooth has not been removed.  Patient state treating dentist has been trying to arrange for orthodontist to remove foreign body.  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  Acute pain onset last night after work.  Patient rates the pain as a 10/10.  No palliative measure for complaint.  Patient was seen 3 months ago for this same complaint.      History reviewed. No pertinent past medical history.  There are no active problems to display for this patient.   Past Surgical History:  Procedure Laterality Date  . HAND SURGERY      Prior to Admission medications   Medication Sig Start Date End Date Taking? Authorizing Provider  lidocaine (XYLOCAINE) 2 % solution Use as directed 5 mLs in the mouth or throat every 6 (six) hours as needed for mouth pain. 02/03/19   Sable Feil, PA-C  traMADol (ULTRAM) 50 MG tablet Take 1 tablet (50 mg total) by mouth every 12 (twelve) hours as needed. 02/03/19   Sable Feil, PA-C    Allergies Amoxicillin  No family history on file.  Social History Social History   Tobacco Use  . Smoking status: Current Every Day Smoker    Packs/day: 0.50    Types: Cigarettes  . Smokeless tobacco: Never Used  Substance Use Topics  . Alcohol use: Yes    Comment: ocassional  . Drug use: Never    Review of Systems Constitutional: No fever/chills Eyes: No visual changes. ENT: No sore  throat.  Dental pain. Cardiovascular: Denies chest pain. Respiratory: Denies shortness of breath. Gastrointestinal: No abdominal pain.  No nausea, no vomiting.  No diarrhea.  No constipation. Genitourinary: Negative for dysuria. Musculoskeletal: Negative for back pain. Skin: Negative for rash. Neurological: Negative for headaches, focal weakness or numbness. Allergic/Immunilogical: Amoxicillin. ____________________________________________   PHYSICAL EXAM:  VITAL SIGNS: ED Triage Vitals  Enc Vitals Group     BP 02/03/19 0721 127/69     Pulse Rate 02/03/19 0721 72     Resp 02/03/19 0721 16     Temp 02/03/19 0721 98.2 F (36.8 C)     Temp Source 02/03/19 0721 Oral     SpO2 02/03/19 0721 94 %     Weight 02/03/19 0717 180 lb (81.6 kg)     Height 02/03/19 0717 5\' 7"  (1.702 m)     Head Circumference --      Peak Flow --      Pain Score 02/03/19 0717 8     Pain Loc --      Pain Edu? --      Excl. in [place]? --     Constitutional: Alert and oriented. Well appearing and in no acute distress. Eyes: Conjunctivae are normal. PERRL. EOMI. Head: Atraumatic. Nose: No congestion/rhinnorhea. Mouth/Throat: Mucous membranes are moist.  Oropharynx non-erythematous.  Devitalized tooth #19.  No edema or erythema. Neck: No stridor.  Hematological/Lymphatic/Immunilogical: No cervical lymphadenopathy. Cardiovascular: Normal rate, regular rhythm. Grossly normal heart sounds.  Good peripheral circulation. Respiratory: Normal respiratory effort.  No retractions. Lungs CTAB.   ____________________________________________   LABS (all labs ordered are listed, but only abnormal results are displayed)  Labs Reviewed - No data to display ____________________________________________  EKG   ____________________________________________  RADIOLOGY  ED MD interpretation:   No obvious foreign body in the left molar. Official radiology report(s): Dg Mandible 1-3 Views  Result Date: 02/03/2019  CLINICAL DATA:  History of broken drill bit during root canal, initial encounter EXAM: MANDIBLE - 1-3 VIEW COMPARISON:  08/17/2010 FINDINGS: Changes consistent with prior artificial tooth are noted in the right central incisor of the maxilla stable from prior exam. No radiopaque foreign body to correspond with the given clinical history is noted. Dental caries are seen with erosive changes of 1 of the mandibular molars likely on the left although incompletely evaluated on these two views. IMPRESSION: Dental caries although no changes to suggest retained foreign body are noted. Electronically Signed   By: Alcide CleverMark  Lukens M.D.   On: 02/03/2019 08:35    ____________________________________________   PROCEDURES  Procedure(s) performed (including Critical Care):  Procedures   ____________________________________________   INITIAL IMPRESSION / ASSESSMENT AND PLAN / ED COURSE  As part of my medical decision making, I reviewed the following data within the electronic MEDICAL RECORD NUMBER         Patient presents with dental pain secondary to left molar cavity and suspected foreign body.  Patient state he was told by dentist that the drill bit broke off doing a root canal.  Discussed with patient x-ray findings today show no obvious foreign body and a concern molar.  Patient advised to follow-up with treating dentist.     ____________________________________________   FINAL CLINICAL IMPRESSION(S) / ED DIAGNOSES  Final diagnoses:  Foreign body accidentally left during a procedure  Pain due to dental caries      ED Discharge Orders         Ordered    traMADol (ULTRAM) 50 MG tablet  Every 12 hours PRN     02/03/19 0846    lidocaine (XYLOCAINE) 2 % solution  Every 6 hours PRN     02/03/19 0846           Note:  This document was prepared using Dragon voice recognition software and may include unintentional dictation errors.    Joni ReiningSmith, Ronald K, PA-C 02/03/19 0850    Emily FilbertWilliams,  Jonathan E, MD 02/03/19 1246

## 2019-02-03 NOTE — Discharge Instructions (Signed)
Follow-up with treating dentist. °

## 2019-02-03 NOTE — ED Notes (Signed)
See triage note  States he developed left jaw pain yesterday  States he thinks it is coming from his tooth  States had a root canal done in Jan and the drill bit broke  States his dentist that did the procedure was not able to remove it   States he has had intermittent pain  But pain became more severe yesterday

## 2019-02-03 NOTE — ED Triage Notes (Signed)
Patient presents to the ED with left lower jaw pain since last night.  Patient states he recently had a wisdom tooth drilled and the tip of the drill broke off in patient's tooth and the dentist has not been able to find a surgeon to remove the drill bit up until this time.  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

## 2019-02-27 ENCOUNTER — Other Ambulatory Visit: Payer: Self-pay

## 2019-05-08 ENCOUNTER — Emergency Department (HOSPITAL_COMMUNITY)
Admission: EM | Admit: 2019-05-08 | Discharge: 2019-05-08 | Disposition: A | Discharge status: Home / Self Care | Payer: Self-pay | Attending: Emergency Medicine | Admitting: Emergency Medicine

## 2019-05-08 ENCOUNTER — Encounter (HOSPITAL_COMMUNITY): Payer: Self-pay

## 2019-05-08 ENCOUNTER — Other Ambulatory Visit: Payer: Self-pay

## 2019-05-08 DIAGNOSIS — F17210 Nicotine dependence, cigarettes, uncomplicated: Secondary | ICD-10-CM | POA: Insufficient documentation

## 2019-05-08 DIAGNOSIS — M545 Low back pain, unspecified: Secondary | ICD-10-CM

## 2019-05-08 MED ORDER — NAPROXEN 500 MG PO TABS: 500.0000 mg | ORAL_TABLET | Freq: Two times a day (BID) | ORAL | 0 refills | Status: DC

## 2019-05-08 MED ORDER — METHOCARBAMOL 500 MG PO TABS: 500.0000 mg | ORAL_TABLET | Freq: Three times a day (TID) | ORAL | 0 refills | Status: DC | PRN

## 2019-05-08 NOTE — ED Triage Notes (Signed)
Pt presents to ED with complaints of mid back pain since Wednesday. Pt denies urinary symptoms.

## 2019-05-08 NOTE — ED Provider Notes (Signed)
[facility] EMERGENCY DEPARTMENT Provider Note   CSN: [id number] Arrival date & time: 05/08/19  1342     History   Chief Complaint Chief Complaint  Patient presents with  . Back Pain    HPI Alan James is a 38 y.o. male with a hx of tobacco abuse who presents to the ED with complaints of back pain for the past 5 days.  Patient states pain is located to his mid to lower back, it is constant, worse with movement, slightly improved with heat and ibuprofen at home.  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  Denies any traumatic injury to the back. Denies numbness, tingling, weakness, saddle anesthesia, incontinence to bowel/bladder, fever, chills, IV drug use, dysuria, or hx of cancer. Patient has not had prior back surgeries.      HPI  History reviewed. No pertinent past medical history.  There are no active problems to display for this patient.   Past Surgical History:  Procedure Laterality Date  . HAND SURGERY          Home Medications    Prior to Admission medications   Medication Sig Start Date End Date Taking? Authorizing Provider  lidocaine (XYLOCAINE) 2 % solution Use as directed 5 mLs in the mouth or throat every 6 (six) hours as needed for mouth pain. 02/03/19   Joni ReiningSmith, Ronald K, PA-C  traMADol (ULTRAM) 50 MG tablet Take 1 tablet (50 mg total) by mouth every 12 (twelve) hours as needed. 02/03/19   Joni ReiningSmith, Ronald K, PA-C    Family History No family history on file.  Social History Social History   Tobacco Use  . Smoking status: Current Every Day Smoker    Packs/day: 0.50    Types: Cigarettes  . Smokeless tobacco: Never Used  Substance Use Topics  . Alcohol use: Yes    Comment: ocassional  . Drug use: Never     Allergies   Amoxicillin   Review of Systems Review of Systems  Constitutional: Negative for chills, fever and unexpected weight change.  Respiratory: Negative for  shortness of breath.   Cardiovascular: Negative for chest pain.  Gastrointestinal: Negative for abdominal pain, nausea and vomiting.  Genitourinary: Negative for dysuria.  Musculoskeletal: Positive for back pain.  Neurological: Negative for weakness and numbness.       Negative for saddle anesthesia or bowel/bladder incontinence.      Physical Exam Updated Vital Signs BP 123/80 (BP Location: Right Arm)   Pulse 65   Temp 98.4 F (36.9 C) (Oral)   Resp 18   Ht 5\' 7"  (1.702 m)   Wt 81.6 kg   SpO2 98%   BMI 28.19 kg/m   Physical Exam Constitutional:      General: He is not in acute distress.    Appearance: He is well-developed. He is not toxic-appearing.  HENT:     Head: Normocephalic and atraumatic.  Neck:     Musculoskeletal: Normal range of motion and neck supple. No spinous process tenderness or muscular tenderness.  Cardiovascular:     Rate and Rhythm: Normal rate.  Pulmonary:     Effort: Pulmonary effort is normal.  Musculoskeletal:     Comments: No obvious deformity, appreciable swelling, erythema, ecchymosis, significant open wounds, or increased warmth.  Extremities: Normal ROM. Nontender.  Back: Patient diffusely tender to palpation to the lower thoracic and lumbar region.  No point/focal vertebral tenderness, no palpable  step off or crepitus.  Skin:    General: Skin is warm and dry.     Findings: No rash.  Neurological:     Mental Status: He is alert.     Deep Tendon Reflexes:     Reflex Scores:      Patellar reflexes are 2+ on the right side and 2+ on the left side.    Comments: Sensation grossly intact to bilateral lower extremities. 5/5 symmetric strength with plantar/dorsiflexion bilaterally. Gait is intact without obvious foot drop.     ED Treatments / Results  Labs (all labs ordered are listed, but only abnormal results are displayed) Labs Reviewed - No data to display  EKG None  Radiology No results found.  Procedures Procedures (including  critical care time)  Medications Ordered in ED Medications - No data to display   Initial Impression / Assessment and Plan / ED Course  I have reviewed the triage vital signs and the nursing notes.  Pertinent labs & imaging results that were available during my care of the patient were reviewed by me and considered in my medical decision making (see chart for details).    Patient presents with complaint of back pain.  Patient is nontoxic appearing, vitals are WNL. Patient has normal neurologic exam, no point/focal midline tenderness to palpation. He is ambulatory in the ED.  No back pain red flags. No urinary sxs. Most likely muscle strain versus spasm. Considered disc disease, UTI/pyelonephritis, kidney stone, aortic aneurysm/dissection, cauda equina or epidural abscess however these do not fit clinical picture at this time. Will treat with Naproxen and Robaxin, discussed with patient that they are not to drive or operate heavy machinery while taking Robaxin. I discussed treatment plan, need for PCP follow-up, and return precautions with the patient. Provided opportunity for questions, patient confirmed understanding and is in agreement with plan.   Final Clinical Impressions(s) / ED Diagnoses   Final diagnoses:  Acute midline low back pain without sciatica    ED Discharge Orders         Ordered    naproxen (NAPROSYN) 500 MG tablet  2 times daily     05/08/19 1458    methocarbamol (ROBAXIN) 500 MG tablet  Every 8 hours PRN     05/08/19 1458           Kielan Dreisbach, Glynda Jaeger, PA-C 05/08/19 1459    Milton Ferguson, MD 05/11/19 1208

## 2019-05-08 NOTE — Discharge Instructions (Addendum)
You were seen in the emergency department for back pain today.  °At this time we suspect that your pain is related to a muscle strain/spasm.  ° °I have prescribed you an anti-inflammatory medication and a muscle relaxer.  °- Naproxen is a nonsteroidal anti-inflammatory medication that will help with pain and swelling. Be sure to take this medication as prescribed with food, 1 pill every 12 hours,  It should be taken with food, as it can cause stomach upset, and more seriously, stomach bleeding. Do not take other nonsteroidal anti-inflammatory medications with this such as Advil, Motrin, Aleve, Mobic, Goodie Powder, or Motrin.   ° °- Robaxin is the muscle relaxer I have prescribed, this is meant to help with muscle tightness. Be aware that this medication may make you drowsy therefore the first time you take this it should be at a time you are in an environment where you can rest. Do not drive or operate heavy machinery when taking this medication. Do not drink alcohol or take other sedating medications with this medicine such as narcotics or benzodiazepines.  ° °You make take Tylenol per over the counter dosing with these medications.  ° °We have prescribed you new medication(s) today. Discuss the medications prescribed today with your pharmacist as they can have adverse effects and interactions with your other medicines including over the counter and prescribed medications. Seek medical evaluation if you start to experience new or abnormal symptoms after taking one of these medicines, seek care immediately if you start to experience difficulty breathing, feeling of your throat closing, facial swelling, or rash as these could be indications of a more serious allergic reaction ° ° °The application of heat can help soothe the pain.  Maintaining your daily activities, including walking, is encourged, as it will help you get better faster than just staying in bed. ° °Your pain should get better over the next 2 weeks.   You will need to follow up with  Your primary healthcare provider in 1-2 weeks for reassessment, if you do not have a primary care provider one is provided in your discharge instructions- you may see the [facility] or call the provided phone number. However return to the ER should you develop ne or worsening symptoms or any other concerns including but not limited to severe or worsening pain, low back pain with fever, numbness, weakness, loss of bowel or bladder control, or inability to walk or urinate, you should return to the ER immediately.    °

## 2019-12-18 ENCOUNTER — Other Ambulatory Visit: Payer: Self-pay

## 2019-12-18 ENCOUNTER — Emergency Department (HOSPITAL_COMMUNITY)
Admission: EM | Admit: 2019-12-18 | Discharge: 2019-12-18 | Disposition: A | Discharge status: Home / Self Care | Payer: Self-pay | Attending: Emergency Medicine | Admitting: Emergency Medicine

## 2019-12-18 ENCOUNTER — Encounter (HOSPITAL_COMMUNITY): Payer: Self-pay | Admitting: *Deleted

## 2019-12-18 ENCOUNTER — Emergency Department (HOSPITAL_COMMUNITY): Payer: Self-pay

## 2019-12-18 DIAGNOSIS — Y9367 Activity, basketball: Secondary | ICD-10-CM | POA: Insufficient documentation

## 2019-12-18 DIAGNOSIS — S62347A Nondisplaced fracture of base of fifth metacarpal bone. left hand, initial encounter for closed fracture: Secondary | ICD-10-CM | POA: Insufficient documentation

## 2019-12-18 DIAGNOSIS — Y929 Unspecified place or not applicable: Secondary | ICD-10-CM | POA: Insufficient documentation

## 2019-12-18 DIAGNOSIS — Z791 Long term (current) use of non-steroidal anti-inflammatories (NSAID): Secondary | ICD-10-CM | POA: Insufficient documentation

## 2019-12-18 DIAGNOSIS — Y999 Unspecified external cause status: Secondary | ICD-10-CM | POA: Insufficient documentation

## 2019-12-18 DIAGNOSIS — F17210 Nicotine dependence, cigarettes, uncomplicated: Secondary | ICD-10-CM | POA: Insufficient documentation

## 2019-12-18 DIAGNOSIS — W010XXA Fall on same level from slipping, tripping and stumbling without subsequent striking against object, initial encounter: Secondary | ICD-10-CM | POA: Insufficient documentation

## 2019-12-18 NOTE — Discharge Instructions (Signed)
Fracture care There is evidence of a fracture on the x-ray. Pain:  Antiinflammatory medications: Take 600 mg of ibuprofen every 6 hours or 440 mg (over the counter dose) to 500 mg (prescription dose) of naproxen every 12 hours for the next 3 days. After this time, these medications may be used as needed for pain. Take these medications with food to avoid upset stomach. Choose only one of these medications, do not take them together. Acetaminophen (generic for Tylenol): Should you continue to have additional pain while taking the ibuprofen or naproxen, you may add in acetaminophen as needed. Your daily total maximum amount of acetaminophen from all sources should be limited to 4000mg /day for persons without liver problems, or 2000mg /day for those with liver problems. Ice: May apply ice to the injured area for no more than 15 minutes at a time to reduce swelling and pain. Elevation: Keep the extremity elevated whenever possible to reduce swelling and pain. Splint: Keep the splint clean and dry.  Protect it from water during bathing.  If the splint gets wet, you will need to have it reapplied.  Do not leave a wet splint against the skin as this can cause skin breakdown.  Call the orthopedist office or come to the ED for splint replacement, if needed.  Follow-up: The hand surgery office should make contact with you, likely tomorrow.  If you have not heard from them by end of day tomorrow, call the office the next day.  You will likely be seen in the office in about a week. Return: Return to the emergency department for severely increased pain, numbness, blanching of the skin, or any other major concerns.

## 2019-12-18 NOTE — ED Provider Notes (Signed)
[facility] Provider Note   CSN: [id number] Arrival date & time: 12/18/19  1013     History Chief Complaint  Patient presents with  . Hand Injury    Alan James is a 39 y.o. male.  HPI      Alan James is a 39 y.o. male, with a history of left hand surgery, presenting to the ED with left hand injury that occurred last night.  Patient states he was playing basketball, jumped up for a shot, was knocked down by another player, and landed on the ulnar side of the left hand.  He has had increasing pain since then, throbbing, radiating towards the left wrist, currently 8/10. He also notes decreased sensation in the left small finger.  He had previous operative fracture repair performed by Dr. Owens Shark in [place], [place] 5 years ago.  He has not had any pain, weakness, or numbness to the hand or fingers following the surgery.  Denies weakness, wrist swelling, forearm or elbow injury, or any other complaints.   History reviewed. No pertinent past medical history.  There are no problems to display for this patient.   Past Surgical History:  Procedure Laterality Date  . HAND SURGERY Left 2016   Surgical repair of fracture; Dr. Norwood Levo, [place]       History reviewed. No pertinent family history.  Social History   Tobacco Use  . Smoking status: Current Every Day Smoker    Packs/day: 0.50    Types: Cigarettes  . Smokeless tobacco: Never Used  Substance Use Topics  . Alcohol use: Yes    Comment: ocassional  . Drug use: Never    Home Medications Prior to Admission medications   Medication Sig Start Date End Date Taking? Authorizing Provider  lidocaine (XYLOCAINE) 2 % solution Use as directed 5 mLs in the mouth or throat every 6 (six) hours as needed for mouth pain. 02/03/19   Sable Feil, PA-C  methocarbamol (ROBAXIN) 500 MG tablet Take 1 tablet (500 mg total) by mouth every 8 (eight) hours as needed for muscle spasms.  05/08/19   Petrucelli, Samantha R, PA-C  naproxen (NAPROSYN) 500 MG tablet Take 1 tablet (500 mg total) by mouth 2 (two) times daily. 05/08/19   Petrucelli, Samantha R, PA-C  traMADol (ULTRAM) 50 MG tablet Take 1 tablet (50 mg total) by mouth every 12 (twelve) hours as needed. 02/03/19   Sable Feil, PA-C    Allergies    Amoxicillin  Review of Systems   Review of Systems  Musculoskeletal: Positive for arthralgias.  Neurological: Positive for numbness. Negative for weakness.    Physical Exam Updated Vital Signs BP 128/75   Pulse 73   Temp 97.7 F (36.5 C)   Resp 20   SpO2 98%   Physical Exam Vitals and nursing note reviewed.  Constitutional:      General: He is not in acute distress.    Appearance: He is well-developed. He is not diaphoretic.  HENT:     Head: Normocephalic and atraumatic.  Eyes:     Conjunctiva/sclera: Conjunctivae normal.  Cardiovascular:     Rate and Rhythm: Normal rate and regular rhythm.     Pulses: Normal pulses.          Radial pulses are 2+ on the right side and 2+ on the left side.     Comments: Similar tactile temperature of the fingers of the left hand. Pulmonary:     Effort: Pulmonary effort is  normal.  Musculoskeletal:     Cervical back: Neck supple.     Comments: Tenderness along the ulnar side of the left hand over the fifth metacarpal.  Tenderness does not cross the MCP joint.  Some tenderness into the ulnar side of the left wrist.  Questionable swelling in the left ulnar side of the hand. Full range of motion in the left wrist. No tenderness, swelling, or other abnormality noted in the left forearm or elbow.  No pain with range of motion of the left elbow.  Skin:    General: Skin is warm and dry.     Capillary Refill: Capillary refill takes less than 2 seconds.     Coloration: Skin is not pale.  Neurological:     Mental Status: He is alert.     Comments: Patient endorses decreased sensation to light touch in the left fifth  finger. Sensation to light touch grossly intact in the rest of the left hand and fingers. Flexion and extension fully intact in the joints of the fingers of the left hand.  Psychiatric:        Behavior: Behavior normal.     ED Results / Procedures / Treatments   Labs (all labs ordered are listed, but only abnormal results are displayed) Labs Reviewed - No data to display  EKG None  Radiology DG Hand Complete Left  Result Date: 12/18/2019 CLINICAL DATA:  Injury, someone fell on hand last night, prior surgery LEFT hand EXAM: LEFT HAND - COMPLETE 3+ VIEW COMPARISON:  None FINDINGS: Cannulated screws are identified traversing the fourth and fifth CMC joints. Lucency identified surrounding the head of the screw at the base of the fifth metacarpal. Additionally, nondisplaced fracture identified at the base of the fifth metacarpal, likely transverse metaphyseal though this is seen only on the PA view; no definite articular extension is seen. No additional fracture, dislocation or bone destruction. IMPRESSION: Prior fusion of the LEFT fourth and fifth CMC joints. Lucency surrounding the head of the screw at the base of the fifth metacarpal which could reflect loosening or infection. Nondisplaced fracture at base of LEFT fifth metacarpal. Electronically Signed   By: Ulyses Southward M.D.   On: 12/18/2019 11:43    Procedures Procedures (including critical care time)  Medications Ordered in ED Medications - No data to display  ED Course  I have reviewed the triage vital signs and the nursing notes.  Pertinent labs & imaging results that were available during my care of the patient were reviewed by me and considered in my medical decision making (see chart for details).  Clinical Course as of Dec 17 1357  Mon Dec 18, 2019  1247 Spoke with Dr. Janee Morn, hand surgery.  Discussed mechanism of injury, x-ray findings, and physical exam findings, including areas of tenderness as well as decreased  sensation in the fifth finger. Agrees with plan for ulnar gutter splint. His office will call the patient tomorrow morning to set up an appointment likely for next Tuesday, May 4.   [SJ]    Clinical Course User Index [SJ] Skilar Marcou, Hillard Danker, PA-C   MDM Rules/Calculators/A&P                      Patient presents with left hand injury.  There appears to be a fracture to the fifth metacarpal on x-ray.  He does have some decreased sensation in the fifth finger, but motor function appears to be appropriately intact.  No evidence of vascular compromise.  Patient splinted and signs of circulation, motor function, and sensation are consistent with presplinting assessment. The patient was given instructions for home care as well as return precautions. Patient voices understanding of these instructions, accepts the plan, and is comfortable with discharge.  I reviewed and interpreted the patient's radiological studies.     Final Clinical Impression(s) / ED Diagnoses Final diagnoses:  Closed nondisplaced fracture of base of fifth metacarpal bone of left hand, initial encounter    Rx / DC Orders ED Discharge Orders    None       Concepcion Living 12/18/19 1401    Vanetta Mulders, MD 12/23/19 1219

## 2019-12-18 NOTE — ED Triage Notes (Signed)
Left hand pain, states someone fell on hand last night

## 2020-06-17 IMAGING — DX DG HAND COMPLETE 3+V*L*
3 series · 3 of 3 positions shown · non-contrast
Comparison: None

CLINICAL DATA: Injury, someone fell on hand last night, prior
surgery LEFT hand

EXAM:
LEFT HAND - COMPLETE 3+ VIEW

[hand pa]
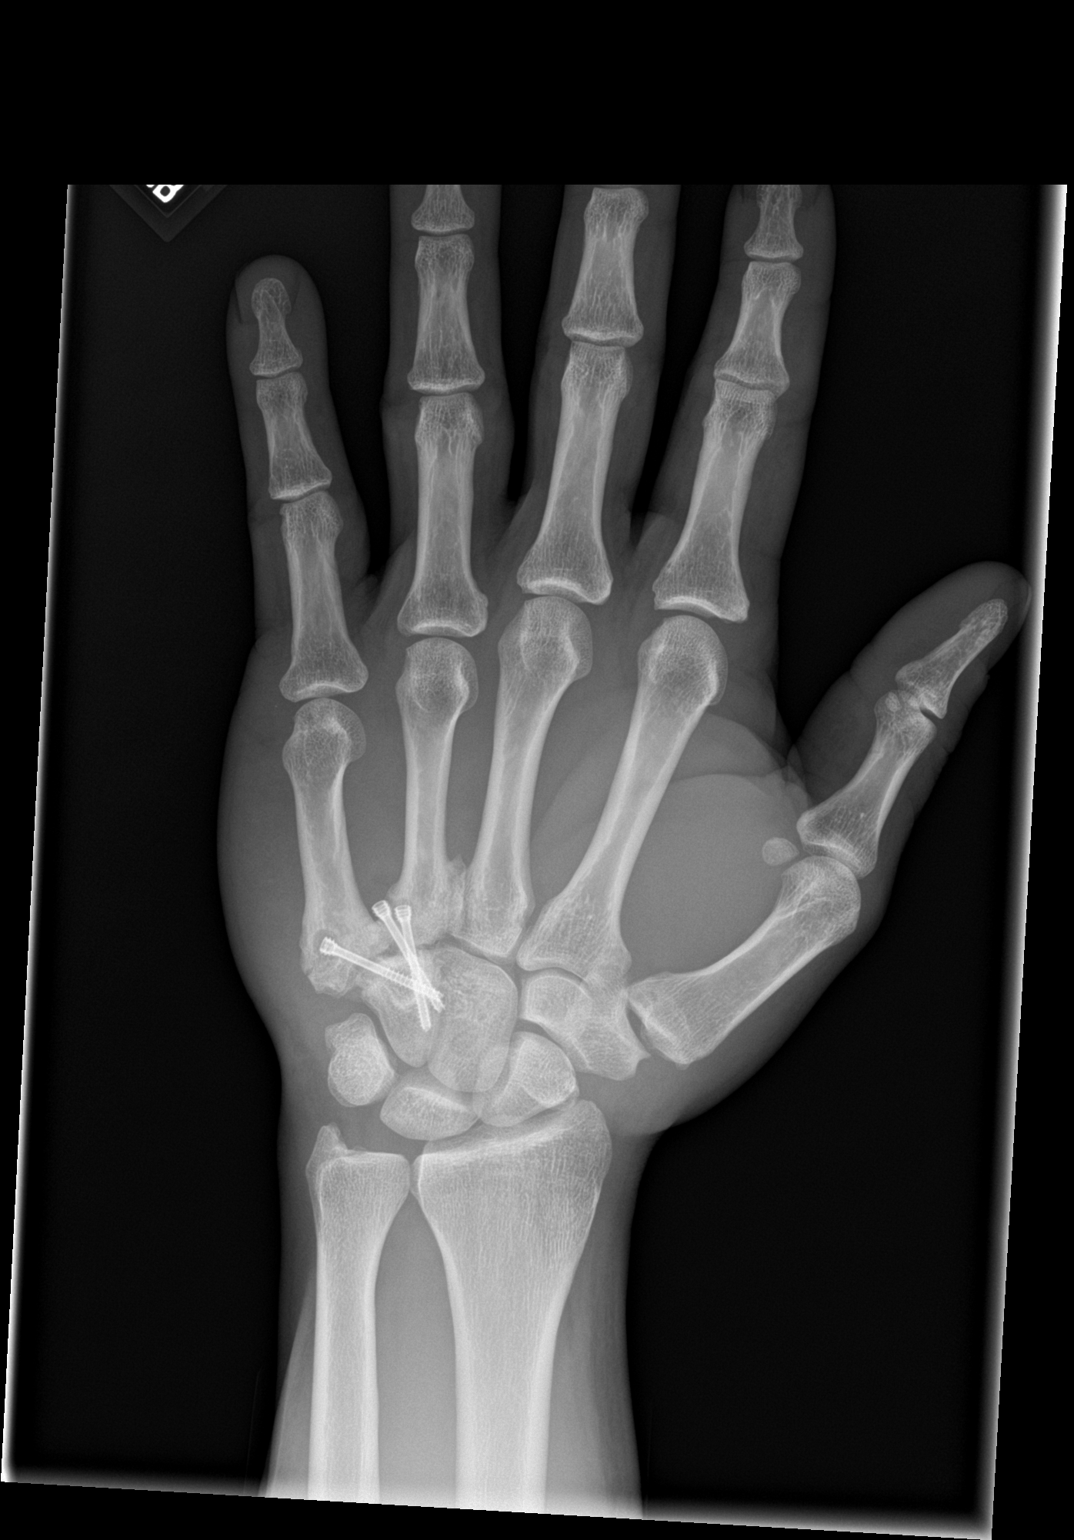

[hand obl]
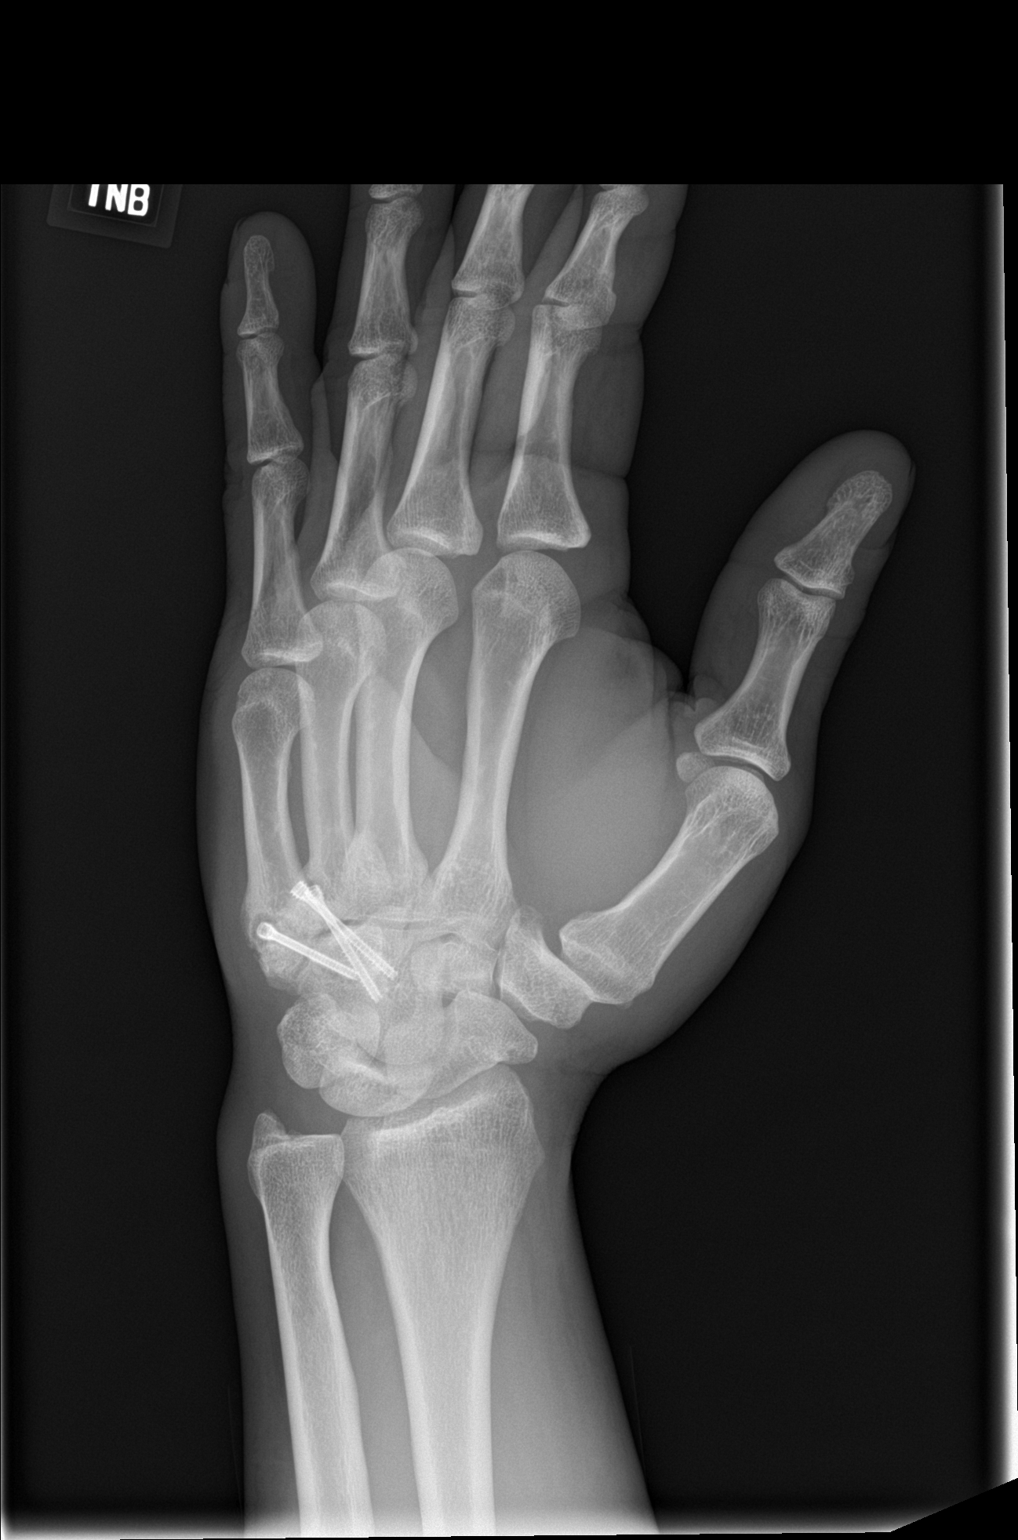

[hand lat]
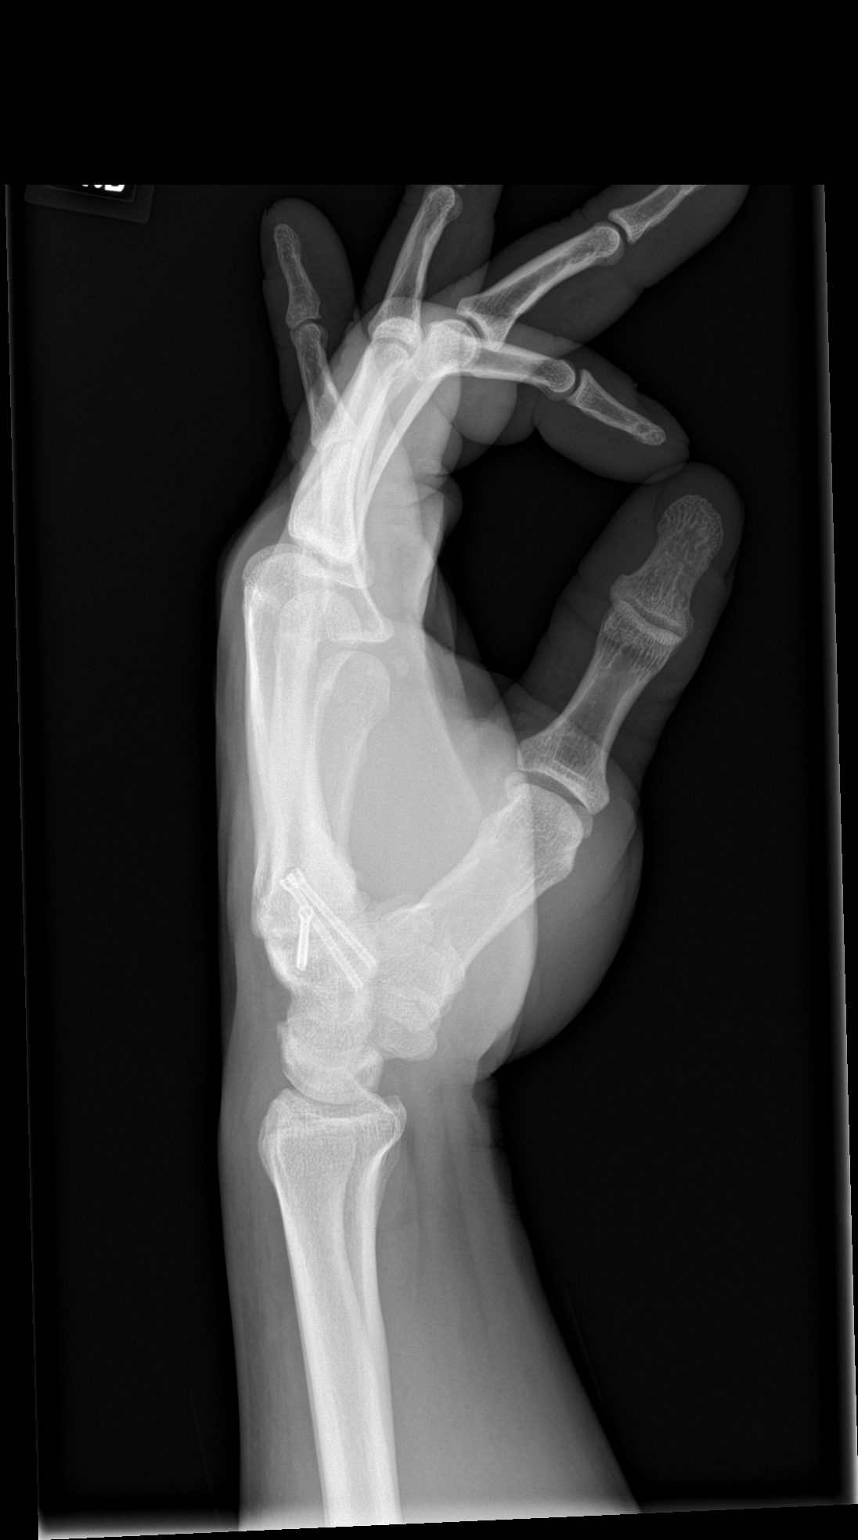

[3 of 3 positions shown; findings below may reference images not displayed]

FINDINGS: Cannulated screws are identified traversing the fourth and fifth CMC
joints.

Lucency identified surrounding the head of the screw at the base of
the fifth metacarpal.

Additionally, nondisplaced fracture identified at the base of the
fifth metacarpal, likely transverse metaphyseal though this is seen
only on the PA view; no definite articular extension is seen.

No additional fracture, dislocation or bone destruction.
IMPRESSION: Prior fusion of the LEFT fourth and fifth CMC joints.

Lucency surrounding the head of the screw at the base of the fifth
metacarpal which could reflect loosening or infection.

Nondisplaced fracture at base of LEFT fifth metacarpal.

## 2021-06-09 ENCOUNTER — Other Ambulatory Visit: Payer: Self-pay

## 2021-06-09 ENCOUNTER — Emergency Department (HOSPITAL_COMMUNITY)
Admission: EM | Admit: 2021-06-09 | Discharge: 2021-06-09 | Disposition: A | Discharge status: Home / Self Care | Payer: Self-pay | Attending: Emergency Medicine | Admitting: Emergency Medicine

## 2021-06-09 ENCOUNTER — Emergency Department (HOSPITAL_COMMUNITY): Payer: Self-pay

## 2021-06-09 ENCOUNTER — Encounter (HOSPITAL_COMMUNITY): Payer: Self-pay | Admitting: *Deleted

## 2021-06-09 DIAGNOSIS — S4991XA Unspecified injury of right shoulder and upper arm, initial encounter: Secondary | ICD-10-CM | POA: Insufficient documentation

## 2021-06-09 DIAGNOSIS — X500XXA Overexertion from strenuous movement or load, initial encounter: Secondary | ICD-10-CM | POA: Insufficient documentation

## 2021-06-09 DIAGNOSIS — F17210 Nicotine dependence, cigarettes, uncomplicated: Secondary | ICD-10-CM | POA: Insufficient documentation

## 2021-06-09 DIAGNOSIS — M25511 Pain in right shoulder: Secondary | ICD-10-CM

## 2021-06-09 DIAGNOSIS — Y990 Civilian activity done for income or pay: Secondary | ICD-10-CM | POA: Insufficient documentation

## 2021-06-09 MED ORDER — PREDNISONE 10 MG PO TABS: 30.0000 mg | ORAL_TABLET | Freq: Every day | ORAL | 0 refills | Status: DC

## 2021-06-09 MED ORDER — KETOROLAC TROMETHAMINE 30 MG/ML IJ SOLN
15.0000 mg | Freq: Once | INTRAMUSCULAR | Status: AC
  Administered 2021-06-09: 15 mg via INTRAMUSCULAR
  Filled 2021-06-09: qty 1

## 2021-06-09 NOTE — Discharge Instructions (Signed)
You have been seen here for right shoulder pain. I recommend taking over-the-counter pain medications like ibuprofen and/or Tylenol every 6 as needed.  Please follow dosage and on the back of bottle.  I also recommend applying heat to the area and stretching out the muscles as this will help decrease stiffness and pain.  I have given you information on exercises please follow.  Given prescription for steroids please take as prescribed.  Follow-up with orthopedic surgery.  Come back to the emergency department if you develop chest pain, shortness of breath, severe abdominal pain, uncontrolled nausea, vomiting, diarrhea.

## 2021-06-09 NOTE — ED Triage Notes (Signed)
Pain in right shoulder from lifting at work

## 2021-06-09 NOTE — ED Provider Notes (Signed)
[facility] EMERGENCY DEPARTMENT Provider Note   CSN: [id number] Arrival date & time: 06/09/21  1352     History Chief Complaint  Patient presents with   Shoulder Pain    Alan James is a 40 y.o. male.  HPI  Patient with no significant medical history presents to the emergency department with chief complaint of right-sided shoulder pain.  Patient states he has had this shoulder pain for the last few weeks, came on suddenly, he denies  associated trauma to the area.  He states that in his work he does a lot of heavy lifting, thinks he might of strained his right shoulder.  He states the shoulder pain remains in his shoulder will sometimes go into his right neck.  He denies  paresthesia or weakness in his upper extremities, has no associated fevers, chills, neck pain, chest pain, shortness breath, worsening pedal edema, denies a history of IV drug use.  States he has been taking ibuprofen and Tylenol without much relief, movement tends make the pain worse, rest helps improve the pain.  He has no other complaints at this time.  History reviewed. No pertinent past medical history.  There are no problems to display for this patient.   Past Surgical History:  Procedure Laterality Date   HAND SURGERY Left 2016   Surgical repair of fracture; Dr. Sanda Linger, [place]       No family history on file.  Social History   Tobacco Use   Smoking status: Every Day    Packs/day: 0.50    Types: Cigarettes   Smokeless tobacco: Never  Substance Use Topics   Alcohol use: Yes    Comment: ocassional   Drug use: Never    Home Medications Prior to Admission medications   Medication Sig Start Date End Date Taking? Authorizing Provider  predniSONE (DELTASONE) 10 MG tablet Take 3 tablets (30 mg total) by mouth daily for 5 days. 06/09/21 06/14/21 Yes Carroll Sage, PA-C  lidocaine (XYLOCAINE) 2 % solution Use as directed 5 mLs in the mouth or throat every 6 (six) hours as  needed for mouth pain. 02/03/19   Joni Reining, PA-C  methocarbamol (ROBAXIN) 500 MG tablet Take 1 tablet (500 mg total) by mouth every 8 (eight) hours as needed for muscle spasms. 05/08/19   Petrucelli, Samantha R, PA-C  naproxen (NAPROSYN) 500 MG tablet Take 1 tablet (500 mg total) by mouth 2 (two) times daily. 05/08/19   Petrucelli, Samantha R, PA-C  traMADol (ULTRAM) 50 MG tablet Take 1 tablet (50 mg total) by mouth every 12 (twelve) hours as needed. 02/03/19   Joni Reining, PA-C    Allergies    Amoxicillin  Review of Systems   Review of Systems  Constitutional:  Negative for chills and fever.  HENT:  Negative for congestion.   Respiratory:  Negative for shortness of breath.   Cardiovascular:  Negative for chest pain.  Gastrointestinal:  Negative for abdominal pain.  Genitourinary:  Negative for enuresis.  Musculoskeletal:  Negative for back pain.       Right shoulder pain.  Skin:  Negative for rash.  Neurological:  Negative for dizziness.  Hematological:  Does not bruise/bleed easily.   Physical Exam Updated Vital Signs BP 129/82 (BP Location: Right Arm)   Pulse 74   Temp 98.8 F (37.1 C) (Oral)   Resp 14   Ht 5\' 7"  (1.702 m)   Wt 95.3 kg   SpO2 99%   BMI 32.89 kg/m  Physical Exam Vitals and nursing note reviewed.  Constitutional:      General: He is not in acute distress.    Appearance: He is not ill-appearing.  HENT:     Head: Normocephalic and atraumatic.     Nose: No congestion.  Eyes:     Conjunctiva/sclera: Conjunctivae normal.  Cardiovascular:     Rate and Rhythm: Normal rate and regular rhythm.     Pulses: Normal pulses.     Heart sounds: No murmur heard.   No friction rub. No gallop.  Pulmonary:     Effort: No respiratory distress.     Breath sounds: No wheezing, rhonchi or rales.  Musculoskeletal:     Comments: Right shoulder is visualized no overlying erythema or edema present, he has full range of motion in his fingers wrist elbow and  shoulder, he was slightly tender palpation along the [facility] joint, no crepitus or deformities present, spine was palpated was nontender to palpation.  Upper extremities had neurovascular fully intact, equal grip strengths.  Skin:    General: Skin is warm and dry.  Neurological:     Mental Status: He is alert.  Psychiatric:        Mood and Affect: Mood normal.    ED Results / Procedures / Treatments   Labs (all labs ordered are listed, but only abnormal results are displayed) Labs Reviewed - No data to display  EKG None  Radiology DG Shoulder Right  Result Date: 06/09/2021 CLINICAL DATA:  Right shoulder pain for several days. No known injury. EXAM: RIGHT SHOULDER - 2+ VIEW COMPARISON:  None. FINDINGS: The mineralization and alignment are normal. There is no evidence of acute fracture or dislocation. The subacromial space is preserved. Mild acromioclavicular degenerative changes. The glenohumeral joint appears unremarkable. IMPRESSION: No acute osseous findings. Mild acromioclavicular degenerative changes. Electronically Signed   By: Carey Bullocks M.D.   On: 06/09/2021 15:45    Procedures Procedures   Medications Ordered in ED Medications  ketorolac (TORADOL) 30 MG/ML injection 15 mg (has no administration in time range)    ED Course  I have reviewed the triage vital signs and the nursing notes.  Pertinent labs & imaging results that were available during my care of the patient were reviewed by me and considered in my medical decision making (see chart for details).    MDM Rules/Calculators/A&P                          Initial impression-patient presents with right shoulder pain.  He is alert, does not appear acute stress and vital signs reassuring.  Triage obtained imaging.  Work-up-imaging is negative for acute findings.  Rule out- I have low suspicion for septic arthritis as patient denies IV drug use, skin exam was performed no erythematous, edematous, warm joints noted on  exam, no new heart murmur heard on exam.  Low suspicion for fracture or dislocation as x-ray does not feel any significant findings. low suspicion for ligament or tendon damage as area was palpated no gross defects noted, they had full range of motion  Low suspicion for compartment syndrome as area was palpated it was soft to the touch, neurovascular fully intact.   Plan-  Right shoulder pain-likely this is acute on chronic pain, will recommend he continue over-the-counter pain medications, will give him a short course of steroids, follow-up with orthopedic surgery for further evaluation.  Vital signs have remained stable, no indication for hospital admission.  Patient  discussed with attending and they agreed with assessment and plan.  Patient given at home care as well strict return precautions.  Patient verbalized that they understood agreed to said plan.  Final Clinical Impression(s) / ED Diagnoses Final diagnoses:  Acute pain of right shoulder    Rx / DC Orders ED Discharge Orders          Ordered    predniSONE (DELTASONE) 10 MG tablet  Daily        06/09/21 1849             Carroll Sage, PA-C 06/09/21 1851    Derwood Kaplan, MD 06/17/21 1045

## 2021-06-13 ENCOUNTER — Emergency Department
Admission: EM | Admit: 2021-06-13 | Discharge: 2021-06-13 | Disposition: A | Discharge status: Home / Self Care | Payer: Self-pay | Attending: Emergency Medicine | Admitting: Emergency Medicine

## 2021-06-13 ENCOUNTER — Other Ambulatory Visit: Payer: Self-pay

## 2021-06-13 DIAGNOSIS — M25511 Pain in right shoulder: Secondary | ICD-10-CM | POA: Insufficient documentation

## 2021-06-13 DIAGNOSIS — F17210 Nicotine dependence, cigarettes, uncomplicated: Secondary | ICD-10-CM | POA: Insufficient documentation

## 2021-06-13 MED ORDER — ORPHENADRINE CITRATE ER 100 MG PO TB12: 100.0000 mg | ORAL_TABLET | Freq: Two times a day (BID) | ORAL | 0 refills | Status: DC

## 2021-06-13 MED ORDER — NAPROXEN 500 MG PO TABS: 500.0000 mg | ORAL_TABLET | Freq: Two times a day (BID) | ORAL | Status: DC

## 2021-06-13 MED ORDER — LIDOCAINE 5 % EX PTCH
1.0000 | MEDICATED_PATCH | CUTANEOUS | Status: DC
  Administered 2021-06-13: 1 via TRANSDERMAL
  Filled 2021-06-13: qty 1

## 2021-06-13 NOTE — Discharge Instructions (Addendum)
No acute findings on x-ray.  Read and follow discharge care instructions.  Wear arm sling during the day.  Continue previous medication

## 2021-06-13 NOTE — ED Provider Notes (Signed)
[facility] Emergency Department Provider Note   ____________________________________________   Event Date/Time   First MD Initiated Contact with Patient 06/13/21 [phone]     (approximate)  I have reviewed the triage vital signs and the nursing notes.   HISTORY  Chief Complaint Shoulder Pain    HPI Alan James is a 40 y.o. male patient presents with continued shoulder pain secondary to injury occurred at work.  Patient states a barrel fell on his shoulder.  Patient was seen at [facility] emergency room on 06/09/2016.  Patient x-rays were negative for any bony abnormalities.  Patient was given discharge care instruction and a consult orthopedics.  Patient cannot see orthopedics until his appointment on 06/19/2021.  Patient states pain is mostly at the superior aspect of the shoulder.  Denies loss sensation or loss of function.  Rates pain as a 5/10.  Described pain as "achy".  Currently taking anti-inflammatories and muscle relaxers.         History reviewed. No pertinent past medical history.  There are no problems to display for this patient.   Past Surgical History:  Procedure Laterality Date   HAND SURGERY Left 2016   Surgical repair of fracture; Dr. Sanda Linger, [place]    Prior to Admission medications   Medication Sig Start Date End Date Taking? Authorizing Provider  naproxen (NAPROSYN) 500 MG tablet Take 1 tablet (500 mg total) by mouth 2 (two) times daily with a meal. 06/13/21  Yes Joni Reining, PA-C  orphenadrine (NORFLEX) 100 MG tablet Take 1 tablet (100 mg total) by mouth 2 (two) times daily. 06/13/21  Yes Joni Reining, PA-C    Allergies Amoxicillin  No family history on file.  Social History Social History   Tobacco Use   Smoking status: Every Day    Packs/day: 0.50    Types: Cigarettes   Smokeless tobacco: Never  Substance Use Topics   Alcohol use: Yes    Comment: ocassional   Drug use: Never     Review of Systems  Constitutional: No fever/chills Eyes: No visual changes. ENT: No sore throat. Cardiovascular: Denies chest pain. Respiratory: Denies shortness of breath. Gastrointestinal: No abdominal pain.  No nausea, no vomiting.  No diarrhea.  No constipation. Genitourinary: Negative for dysuria. Musculoskeletal: Right shoulder pain skin: Negative for rash. Neurological: Negative for headaches, focal weakness or numbness. Allergic/Immunilogical: Amoxicillin ____________________________________________   PHYSICAL EXAM:  VITAL SIGNS: ED Triage Vitals  Enc Vitals Group     BP      Pulse      Resp      Temp      Temp src      SpO2      Weight      Height      Head Circumference      Peak Flow      Pain Score      Pain Loc      Pain Edu?      Excl. in GC?     Constitutional: Alert and oriented. Well appearing and in no acute distress. Eyes: Conjunctivae are normal. PERRL. EOMI. Head: Atraumatic. Nose: No congestion/rhinnorhea. Mouth/Throat: Mucous membranes are moist.  Oropharynx non-erythematous. Neck: No cervical spine tenderness to palpation. Cardiovascular: Normal rate, regular rhythm. Grossly normal heart sounds.  Good peripheral circulation. Respiratory: Normal respiratory effort.  No retractions. Lungs CTAB. Musculoskeletal: No obvious deformity to the right upper extremity.  Patient is moderate guarding palpation at the [facility] joint. Neurologic:  Normal speech and language. No gross focal neurologic deficits are appreciated. No gait instability. Skin:  Skin is warm, dry and intact. No rash noted. Psychiatric: Mood and affect are normal. Speech and behavior are normal.  ____________________________________________   LABS (all labs ordered are listed, but only abnormal results are displayed)  Labs Reviewed - No data to display ____________________________________________  EKG   ____________________________________________  RADIOLOGY I, Joni Reining, personally viewed and evaluated these images (plain radiographs) as part of my medical decision making, as well as reviewing the written report by the radiologist.  ED MD interpretation: Reviewed x-ray from previous visit showed no bony abnormalities.  Official radiology report(s): No results found.  ____________________________________________   PROCEDURES  Procedure(s) performed (including Critical Care):  Procedures   ____________________________________________   INITIAL IMPRESSION / ASSESSMENT AND PLAN / ED COURSE  As part of my medical decision making, I reviewed the following data within the electronic MEDICAL RECORD NUMBER         Patient complaining of continued shoulder pain secondary to contusion.  Patient placed in arm sling and Lidoderm patch placed as per aspect the shoulder.  Patient given prescription for Norflex and naproxen.  Patient advised to follow-up with scheduled orthopedic appointment.      ____________________________________________   FINAL CLINICAL IMPRESSION(S) / ED DIAGNOSES  Final diagnoses:  Acute pain of right shoulder     ED Discharge Orders          Ordered    orphenadrine (NORFLEX) 100 MG tablet  2 times daily        06/13/21 1033    naproxen (NAPROSYN) 500 MG tablet  2 times daily with meals        06/13/21 1033             Note:  This document was prepared using Dragon voice recognition software and may include unintentional dictation errors.    Joni Reining, PA-C 06/13/21 1038    Minna Antis, MD 06/13/21 [phone]

## 2021-06-13 NOTE — ED Triage Notes (Signed)
Pt c/o right shoulder pain, states he injured lifting heavy equipment at work a few days ago, pt states he was seen for same sx a couple of days ago, noted chart shows seen at [facility]

## 2021-06-13 NOTE — ED Notes (Signed)
See triage note  states he hit his right shoulder couple of weeks ago  was seen the next day  states he is still having pain with some swelling

## 2021-06-19 ENCOUNTER — Ambulatory Visit: Payer: Self-pay | Admitting: Orthopedic Surgery

## 2021-07-10 ENCOUNTER — Telehealth: Payer: Self-pay | Admitting: Orthopedic Surgery

## 2021-07-10 ENCOUNTER — Encounter: Payer: Self-pay | Admitting: Orthopedic Surgery

## 2021-07-10 ENCOUNTER — Ambulatory Visit (INDEPENDENT_AMBULATORY_CARE_PROVIDER_SITE_OTHER): Payer: BC Managed Care – PPO | Admitting: Orthopedic Surgery

## 2021-07-10 ENCOUNTER — Other Ambulatory Visit: Payer: Self-pay

## 2021-07-10 VITALS — BP 127/81 | HR 76 | Ht 67.0 in | Wt 205.0 lb

## 2021-07-10 DIAGNOSIS — M7521 Bicipital tendinitis, right shoulder: Secondary | ICD-10-CM | POA: Diagnosis not present

## 2021-07-10 MED ORDER — IBUPROFEN 800 MG PO TABS
800.0000 mg | ORAL_TABLET | Freq: Three times a day (TID) | ORAL | 1 refills | Status: AC | PRN
Start: 2021-07-10 — End: ?

## 2021-07-10 NOTE — Telephone Encounter (Signed)
Called patient regarding work note from today's visit 07/10/21 - work note being redone,reprinted; patient aware to pick up
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
## 2021-07-10 NOTE — Patient Instructions (Signed)
OOW x 3 weeks

## 2021-07-31 ENCOUNTER — Ambulatory Visit: Payer: BC Managed Care – PPO | Admitting: Orthopedic Surgery

## 2021-11-03 ENCOUNTER — Encounter (HOSPITAL_COMMUNITY): Payer: Self-pay | Admitting: *Deleted

## 2021-11-03 ENCOUNTER — Emergency Department (HOSPITAL_COMMUNITY): Payer: 59

## 2021-11-03 ENCOUNTER — Emergency Department (HOSPITAL_COMMUNITY)
Admission: EM | Admit: 2021-11-03 | Discharge: 2021-11-03 | Disposition: A | Discharge status: Home / Self Care | Payer: 59 | Attending: Emergency Medicine | Admitting: Emergency Medicine

## 2021-11-03 DIAGNOSIS — J45909 Unspecified asthma, uncomplicated: Secondary | ICD-10-CM | POA: Diagnosis not present

## 2021-11-03 DIAGNOSIS — Z20822 Contact with and (suspected) exposure to covid-19: Secondary | ICD-10-CM | POA: Insufficient documentation

## 2021-11-03 DIAGNOSIS — J069 Acute upper respiratory infection, unspecified: Secondary | ICD-10-CM | POA: Insufficient documentation

## 2021-11-03 DIAGNOSIS — R059 Cough, unspecified: Secondary | ICD-10-CM | POA: Diagnosis present

## 2021-11-03 LAB — RESP PANEL BY RT-PCR (FLU A&B, COVID) ARPGX2
Influenza A by PCR: NEGATIVE
Influenza B by PCR: NEGATIVE
SARS Coronavirus 2 by RT PCR: NEGATIVE

## 2021-11-03 MED ORDER — BENZONATATE 200 MG PO CAPS: 200.0000 mg | ORAL_CAPSULE | Freq: Three times a day (TID) | ORAL | 0 refills | Status: DC | PRN

## 2021-11-03 MED ORDER — ALBUTEROL SULFATE HFA 108 (90 BASE) MCG/ACT IN AERS
2.0000 | INHALATION_SPRAY | Freq: Once | RESPIRATORY_TRACT | Status: AC
  Administered 2021-11-03: 2 via RESPIRATORY_TRACT
  Filled 2021-11-03: qty 6.7

## 2021-11-03 MED ORDER — PREDNISONE 20 MG PO TABS: 40.0000 mg | ORAL_TABLET | Freq: Every day | ORAL | 0 refills | Status: DC

## 2021-11-03 NOTE — ED Provider Notes (Incomplete)
[facility] EMERGENCY DEPARTMENT Provider Note   CSN: [id number] Arrival date & time: 11/03/21  1050     History {Add pertinent medical, surgical, social history, OB history to HPI:1} No chief complaint on file.   Alan James is a 41 y.o. male.  HPI      Alan James is a 41 y.o. male who presents to the Emergency Department complaining of fever, cough, chest congestion.  Symptoms have been present for 3 days.  Patient's significant other and son with similar symptoms recently.  His son is also here for evaluation of similar symptoms.  Subjective fever at home.  He has been using his wife's albuterol inhaler with some relief.  He reports having some shortness of breath associated with coughing and exertion.  History of childhood asthma.  States he does not require any bronchodilators.  Denies any abdominal pain, nausea, vomiting, or diarrhea.  Home Medications Prior to Admission medications   Medication Sig Start Date End Date Taking? Authorizing Provider  acetaminophen (TYLENOL) 500 MG tablet Take by mouth every 6 (six) hours as needed.    [provider]  ibuprofen (ADVIL) 800 MG tablet Take 1 tablet (800 mg total) by mouth every 8 (eight) hours as needed. 07/10/21   Vickki Hearing, MD      Allergies    Amoxicillin    Review of Systems   Review of Systems  Constitutional:  Positive for fever.  HENT:  Positive for congestion. Negative for sore throat.   Respiratory:  Positive for cough, chest tightness and shortness of breath.   Gastrointestinal:  Negative for diarrhea, nausea and vomiting.  Genitourinary:  Negative for dysuria.  Musculoskeletal:  Negative for arthralgias, neck pain and neck stiffness.  Skin:  Negative for rash.  Neurological:  Negative for dizziness, syncope, numbness and headaches.  All other systems reviewed and are negative.  Physical Exam Updated Vital Signs BP 123/85 (BP Location: Right Arm)    Pulse 92    Temp 98.2 F (36.8  C) (Oral)    Resp 20    Ht 5\' 7"  (1.702 m)    Wt 93 kg    SpO2 99%    BMI 32.11 kg/m  Physical Exam Vitals and nursing note reviewed.  Constitutional:      General: He is not in acute distress.    Appearance: Normal appearance. He is not toxic-appearing.  HENT:     Right Ear: Tympanic membrane and ear canal normal.     Left Ear: Ear canal normal.     Ears:     Comments: Erythema of the left TM.  No bulging or perforation.    Mouth/Throat:     Mouth: Mucous membranes are moist.     Pharynx: Oropharynx is clear.  Cardiovascular:     Rate and Rhythm: Normal rate and regular rhythm.     Pulses: Normal pulses.  Pulmonary:     Effort: Pulmonary effort is normal.     Breath sounds: Normal breath sounds.  Abdominal:     Palpations: Abdomen is soft.     Tenderness: There is no abdominal tenderness.  Musculoskeletal:        General: Normal range of motion.     Cervical back: Normal range of motion. No rigidity.     Right lower leg: No edema.     Left lower leg: No edema.  Lymphadenopathy:     Cervical: No cervical adenopathy.  Skin:    General: Skin is warm.  Capillary Refill: Capillary refill takes less than 2 seconds.     Findings: No rash.  Neurological:     General: No focal deficit present.     Mental Status: He is alert.     Sensory: No sensory deficit.     Motor: No weakness.    ED Results / Procedures / Treatments   Labs (all labs ordered are listed, but only abnormal results are displayed) Labs Reviewed  RESP PANEL BY RT-PCR (FLU A&B, COVID) [id number]    EKG None  Radiology DG Chest Portable 1 View  Result Date: 11/03/2021 CLINICAL DATA:  Shortness of breath with fever and cough. EXAM: PORTABLE CHEST 1 VIEW COMPARISON:  08/10/2011 FINDINGS: 1320 hours. The lungs are clear without focal pneumonia, edema, pneumothorax or pleural effusion. The cardiopericardial silhouette is within normal limits for size. The visualized bony structures of the thorax are  unremarkable. IMPRESSION: No active disease. Electronically Signed   By: Kennith Center M.D.   On: 11/03/2021 13:25    Procedures Procedures  {Document cardiac monitor, telemetry assessment procedure when appropriate:1}  Medications Ordered in ED Medications  albuterol (VENTOLIN HFA) 108 (90 Base) MCG/ACT inhaler 2 puff (has no administration in time range)    ED Course/ Medical Decision Making/ A&P                           Medical Decision Making Patient here for evaluation of cough, shortness of breath with cough and exertion.  Subjective fever at home.  History of childhood asthma.  Has used his wife's albuterol inhaler with some relief.  Other household members with similar symptoms recently, his son is also here for evaluation of similar symptoms.  On exam, patient is well-appearing nontoxic.  His vital signs are very reassuring.  No increased work of breathing or wheezing on exam.  He is PERC negative.  Doubt cardiac process.  Likely viral.  Amount and/or Complexity of Data Reviewed External Data Reviewed: labs.    Details: Labs interpreted by me, COVID and influenza testing are negative. Radiology: ordered.    Details: Chest x-ray without evidence of acute cardiopulmonary process.  Risk Prescription drug management.   Patient here with likely viral process.  Other family members have similar symptoms.  He does not appear toxic.  His vital signs are reassuring. We will treat symptomatically with antitussive, albuterol MDI and Tessalon Perles.  He will follow-up closely with PCP.  He appears appropriate for discharge home.  Return precautions were also discussed.  {Document critical care time when appropriate:1} {Document review of labs and clinical decision tools ie heart score, Chads2Vasc2 etc:1}  {Document your independent review of radiology images, and any outside records:1} {Document your discussion with family members, caretakers, and with consultants:1} {Document  social determinants of health affecting pt's care:1} {Document your decision making why or why not admission, treatments were needed:1} Final Clinical Impression(s) / ED Diagnoses Final diagnoses:  None    Rx / DC Orders ED Discharge Orders     None

## 2021-11-03 NOTE — Discharge Instructions (Signed)
Your chest x-ray COVID and flu test today were all reassuring.  You likely have a viral process.  Please use the albuterol inhaler 1 to 2 puffs every 4-6 hours as needed.  Take the prednisone as directed until finished.  The Tessalon should help with your cough.  Drink plenty of fluids, you may take Tylenol every 4 hours if needed for body aches and/or fever.  Please follow-up with your primary care provider or return emergency department for any new or worsening symptoms. ?

## 2021-11-03 NOTE — ED Triage Notes (Signed)
Fever, cough x 3 days

## 2021-11-28 NOTE — Addendum Note (Signed)
Encounter addended by: Kem Parkinson, PA-C on: 11/28/2021 9:27 AM ? Actions taken: Letter saved

## 2022-10-22 ENCOUNTER — Encounter: Payer: Self-pay | Admitting: Radiology

## 2023-01-01 ENCOUNTER — Emergency Department
Admission: EM | Admit: 2023-01-01 | Discharge: 2023-01-01 | Disposition: A | Discharge status: Home / Self Care | Payer: 59 | Attending: Emergency Medicine | Admitting: Emergency Medicine

## 2023-01-01 DIAGNOSIS — Y92410 Unspecified street and highway as the place of occurrence of the external cause: Secondary | ICD-10-CM | POA: Insufficient documentation

## 2023-01-01 DIAGNOSIS — M545 Low back pain, unspecified: Secondary | ICD-10-CM | POA: Insufficient documentation

## 2023-01-01 DIAGNOSIS — M542 Cervicalgia: Secondary | ICD-10-CM | POA: Insufficient documentation

## 2023-01-01 DIAGNOSIS — S161XXA Strain of muscle, fascia and tendon at neck level, initial encounter: Secondary | ICD-10-CM
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
## 2023-08-25 HISTORY — PX: OTHER SURGICAL HISTORY: SHX169

## 2024-06-16 ENCOUNTER — Emergency Department (HOSPITAL_COMMUNITY)
Admission: EM | Admit: 2024-06-16 | Discharge: 2024-06-16 | Disposition: A | Discharge status: Home / Self Care | Attending: Emergency Medicine | Admitting: Emergency Medicine

## 2024-06-16 ENCOUNTER — Emergency Department (HOSPITAL_COMMUNITY)

## 2024-06-16 ENCOUNTER — Encounter (HOSPITAL_COMMUNITY): Payer: Self-pay | Admitting: Emergency Medicine

## 2024-06-16 ENCOUNTER — Other Ambulatory Visit: Payer: Self-pay

## 2024-06-16 DIAGNOSIS — J069 Acute upper respiratory infection, unspecified: Secondary | ICD-10-CM | POA: Insufficient documentation

## 2024-06-16 DIAGNOSIS — R059 Cough, unspecified: Secondary | ICD-10-CM | POA: Diagnosis present

## 2024-06-16 DIAGNOSIS — R079 Chest pain, unspecified: Secondary | ICD-10-CM

## 2024-06-16 LAB — BASIC METABOLIC PANEL WITH GFR
Anion gap: 10 (ref 5–15)
BUN: 6 mg/dL (ref 6–20)
CO2: 24 mmol/L (ref 22–32)
Calcium: 9 mg/dL (ref 8.9–10.3)
Chloride: 105 mmol/L (ref 98–111)
Creatinine, Ser: 0.87 mg/dL (ref 0.61–1.24)
GFR, Estimated: >60 mL/min (ref >60)
Glucose, Bld: 96 mg/dL (ref 70–99)
Potassium: 3.6 mmol/L (ref 3.5–5.1)
Sodium: 140 mmol/L (ref 135–145)

## 2024-06-16 LAB — CBC
HCT: 42.7 % (ref 39.0–52.0)
Hemoglobin: 15.5 g/dL (ref 13.0–17.0)
MCH: 34.9 pg — ABNORMAL HIGH (ref 26.0–34.0)
MCHC: 36.3 g/dL — ABNORMAL HIGH (ref 30.0–36.0)
MCV: 96.2 fL (ref 80.0–100.0)
Platelets: 247 K/uL (ref 150–400)
RBC: 4.44 MIL/uL (ref 4.22–5.81)
RDW: 12.9 % (ref 11.5–15.5)
WBC: 4 K/uL (ref 4.0–10.5)
nRBC: 0 % (ref 0.0–0.2)

## 2024-06-16 LAB — PRO BRAIN NATRIURETIC PEPTIDE: Pro Brain Natriuretic Peptide: <50 pg/mL (ref <300.0)

## 2024-06-16 LAB — TROPONIN T, HIGH SENSITIVITY: Troponin T High Sensitivity: <15 ng/L (ref 0–19)

## 2024-06-16 MED ORDER — BENZONATATE 100 MG PO CAPS
100.0000 mg | ORAL_CAPSULE | Freq: Once | ORAL | Status: AC
  Administered 2024-06-16: 100 mg via ORAL
  Filled 2024-06-16: qty 1

## 2024-06-16 MED ORDER — IBUPROFEN 400 MG PO TABS
600.0000 mg | ORAL_TABLET | Freq: Once | ORAL | Status: AC
  Administered 2024-06-16: 600 mg via ORAL
  Filled 2024-06-16: qty 2

## 2024-06-16 MED ORDER — ACETAMINOPHEN 500 MG PO TABS
1000.0000 mg | ORAL_TABLET | Freq: Once | ORAL | Status: AC
  Administered 2024-06-16: 1000 mg via ORAL
  Filled 2024-06-16: qty 2

## 2024-06-16 MED ORDER — ONDANSETRON 4 MG PO TBDP: 4.0000 mg | ORAL_TABLET | Freq: Three times a day (TID) | ORAL | 0 refills | Status: DC | PRN

## 2024-06-16 MED ORDER — ONDANSETRON HCL 4 MG PO TABS
4.0000 mg | ORAL_TABLET | Freq: Once | ORAL | Status: AC
  Administered 2024-06-16: 4 mg via ORAL
  Filled 2024-06-16: qty 1

## 2024-06-16 NOTE — ED Provider Notes (Signed)
 [place] EMERGENCY DEPARTMENT AT [facility] Provider Note   CSN: [id number] Arrival date & time: 06/16/24  1909     History  Chief Complaint  Patient presents with   Cough   Chest Pain    Alan James is a 43 y.o. male with PMH as listed below who presents with cough that started today that worsened over the course of the day. Notes that his chest began feeling tight and weird and painful. No wheezing, No sore throat, rhinorrhea, nasal congestion, diarrhea. Endorses some nausea with the coughing. States he had similar symptoms last time he was diagnosed with an upper respiratory infection. No recent travel/hospitalizations/surgeries. No h/o DVT/PE. No leg swelling.    History reviewed. No pertinent past medical history.     Home Medications Prior to Admission medications   Medication Sig Start Date End Date Taking? Authorizing Provider  acetaminophen  (TYLENOL ) 500 MG tablet Take by mouth every 6 (six) hours as needed.    [provider]  benzonatate  (TESSALON ) 200 MG capsule Take 1 capsule (200 mg total) by mouth 3 (three) times daily as needed for cough. Swallow whole, do not chew 11/03/21   Triplett, Tammy, PA-C  ibuprofen  (ADVIL ) 800 MG tablet Take 1 tablet (800 mg total) by mouth every 8 (eight) hours as needed. 07/10/21   Margrette Taft BRAVO, MD  predniSONE  (DELTASONE ) 20 MG tablet Take 2 tablets (40 mg total) by mouth daily. 11/03/21   Triplett, Tammy, PA-C      Allergies    Amoxicillin    Review of Systems   Review of Systems A 10 point review of systems was performed and is negative unless otherwise reported in HPI.  Physical Exam Updated Vital Signs BP 130/83   Pulse 77   Temp 98.8 F (37.1 C) (Oral)   Resp 20   Ht 5' 7 (1.702 m)   Wt 102.1 kg   SpO2 97%   BMI 35.25 kg/m  Physical Exam General: Normal appearing male, lying in bed.  HEENT: PERRLA, Sclera anicteric, MMM, trachea midline. Clear oropharynx, symmetric tonsillar  pillars.  Cardiology: RRR, no murmurs/rubs/gallops.  Resp: Normal respiratory rate and effort. CTAB, no wheezes, rhonchi, crackles.  Abd: Soft, non-tender, non-distended. No rebound tenderness or guarding.  GU: Deferred. MSK: No peripheral edema or signs of trauma. Extremities without deformity or TTP.  Skin: warm, dry.  Neuro: A&Ox4, CNs II-XII grossly intact. MAEs. Sensation grossly intact.  Psych: Normal mood and affect.   ED Results / Procedures / Treatments   Labs (all labs ordered are listed, but only abnormal results are displayed) Labs Reviewed  CBC - Abnormal; Notable for the following components:      Result Value   MCH 34.9 (*)    MCHC 36.3 (*)    All other components within normal limits  BASIC METABOLIC PANEL WITH GFR  PRO BRAIN NATRIURETIC PEPTIDE  TROPONIN T, HIGH SENSITIVITY    EKG EKG Interpretation Date/Time:  Friday June 16 2024 19:21:04 EDT Ventricular Rate:  75 PR Interval:  144 QRS Duration:  86 QT Interval:  368 QTC Calculation: 410 R Axis:   101  Text Interpretation: Sinus rhythm with marked sinus arrhythmia Rightward axis Confirmed by Franklyn Gills [phone]) on 06/16/2024 8:00:19 PM  Radiology DG Chest 2 View Result Date: 06/16/2024 CLINICAL DATA:  Cough and chest pain EXAM: CHEST - 2 VIEW COMPARISON:  11/03/2021 FINDINGS: The heart size and mediastinal contours are within normal limits. Both lungs are clear. The visualized skeletal  structures are unremarkable. IMPRESSION: No active cardiopulmonary disease. Electronically Signed   By: Luke Bun M.D.   On: 06/16/2024 19:32    Procedures Procedures    Medications Ordered in ED Medications  ondansetron (ZOFRAN) tablet 4 mg (4 mg Oral Given 06/16/24 2156)  acetaminophen  (TYLENOL ) tablet 1,000 mg (1,000 mg Oral Given 06/16/24 2155)  ibuprofen  (ADVIL ) tablet 600 mg (600 mg Oral Given 06/16/24 2154)  benzonatate  (TESSALON ) capsule 100 mg (100 mg Oral Given 06/16/24 2155)    ED Course/  Medical Decision Making/ A&P                          Medical Decision Making Amount and/or Complexity of Data Reviewed Labs: ordered. Decision-making details documented in ED Course. Radiology: ordered. Decision-making details documented in ED Course.  Risk OTC drugs. Prescription drug management.    This patient presents to the ED for concern of cough/CP/SOB, this involves an extensive number of treatment options, and is a complaint that carries with it a high risk of complications and morbidity.  I considered the following differential and admission for this acute, potentially life threatening condition. Pt overall very well-appearing and HDS.   MDM:    DDX for chest pain includes but is not limited to:  Very low suspicion for ACS vs aortic dissection given presenting sx. EKG w/o signs of ischemia or arrhythmia. With cough I suspect likely viral upper resp infection/bronchitis as a cause of his sxs and I suspect the chest pain is likely MSK or from coughing. He is treated w/ tylenol , ibuprofen , zofran, and tessalon  perles which improved his sxs. No wheezing or h/o asthma/COPD. No orthopnea/leg swelling/cardiomegaly to indicate HF. No PNA or pulm edema/pleural effusion on CXR. No abd pain to indicate biliary disease. No asymmetric leg swelling or risk factors for PE and patient is able to [facility] out.   Clinical Course as of 06/16/24 2312  Fri Jun 16, 2024  2126 DG Chest 2 View No active cardiopulmonary disease. [HN]  2126 Troponin T High Sensitivity: <15 [HN]  2126 Basic metabolic panel neg [HN]  2126 CBC(!) unremarkable [HN]  2126 SpO2: 97 % Stable on room air [HN]  2225 Pro Brain Natriuretic Peptide: <50.0 neg [HN]  2258 Patient's chest pain improved after Tylenol  ibuprofen , nausea improved after Zofran, but Tessalon  did not improve the cough.  Patient likely with viral upper respiratory syndrome with cough, or viral bronchitis.  Patient without any hypoxia, respiratory distress.   Considered admission but believe he is stable for discharge.  Will prescribe Zofran ODT for nausea.  Advised over-the-counter remedies for cough including Mucinex, tea with honey.  Advised alternating Tylenol  ibuprofen  for pain and bodyaches.  Given specific discharge instructions and return precautions, all questions answered to patient satisfaction. [HN]    Clinical Course User Index [HN] Franklyn Sid SAILOR, MD    Labs: I Ordered, and personally interpreted labs.  The pertinent results include:  those listed above  Imaging Studies ordered: I ordered imaging studies including CXR ordered from triage I independently visualized and interpreted imaging. I agree with the radiologist interpretation  Additional history obtained from chart review.    Reevaluation: After the interventions noted above, I reevaluated the patient and found that they have :improved  Social Determinants of Health: Lives independently  Disposition:  DC w/ discharge instructions/return precautions. All questions answered to patient's satisfaction.    Co morbidities that complicate the patient evaluation History reviewed. No pertinent past medical  history.   Medicines Meds ordered this encounter  Medications   ondansetron (ZOFRAN) tablet 4 mg   acetaminophen  (TYLENOL ) tablet 1,000 mg   ibuprofen  (ADVIL ) tablet 600 mg   benzonatate  (TESSALON ) capsule 100 mg    I have reviewed the patients home medicines and have made adjustments as needed  Problem List / ED Course: Problem List Items Addressed This Visit   None Visit Diagnoses       Viral URI with cough    -  Primary     Chest pain, unspecified type                       This note was created using dictation software, which may contain spelling or grammatical errors.    Franklyn Sid SAILOR, MD 06/20/24 [phone]

## 2024-06-16 NOTE — Discharge Instructions (Addendum)
 Thank you for coming to [facility] Emergency Department. You were seen for cough, chest tightness, chest soreness. We did an exam, labs, and imaging, and these showed likely a viral upper respiratory syndrome with cough.  Please take alternating 1000 mg of Tylenol  every 8 hours and 600 mg ibuprofen  every 8 hours.  You can take Zofran 4 mg under tongue every 6 8 hours as needed for nausea and vomiting.  Please stay well-hydrated at home.  You can use over-the-counter remedies for cough including Mucinex or tea with honey, cough lozenges. Please follow up with your primary care provider within 1 week.   Do not hesitate to return to the ED or call 911 if you experience: -Worsening symptoms -Worsening chest pain or shortness of breath -Lightheadedness, passing out -Fevers/chills -Anything else that concerns you

## 2024-06-16 NOTE — ED Triage Notes (Addendum)
 Pt arrived to ED from home c/o cough that he started this morning. Pt states he had a coughing fit while at work tonight that made him feel like he was going to throw up. Pt states he has had some tightness in his chest since.

## 2024-07-09 NOTE — Nursing Note (Signed)
 Pt received discharge order. Pt a/o x 4. Pt is going home with foley cath per MD order. Medicines, foley cath care and instructions mentioned in AVS explained to the Pt. Pt verbalized understanding.

## 2024-07-09 NOTE — Discharge Summary (Signed)
 [facility]  [facility] Inpatient Discharge Summary  PCP: No primary care provider on file. Discharge Details   Admit date:         07/05/2024 Discharge date:        07/09/2024  Hospital Days:    4 days  Code Status:   Full Code Advanced Directives on file: No Directive        Discharge Diagnoses:  Principal Problem:   Fracture of corpus cavernosum penis, subsequent encounter Active Problems:   Urethral disruption   Task list for follow-up: Follow up in office for cath removal in 3 weeks   Follow-Up Appointments Suggested: No follow-up provider specified. Follow-Up Appointments Already Scheduled: No future appointments.  Discharge Medications:   Current Discharge Medication List    You have not been prescribed any medications.     Allergies: Allergies[1]  Consultations this Admission: IP CONSULT TO IV TEAM IP CONSULT TO IV TEAM  Procedures/Imaging: Procedure(s) (LRB): REPAIR PENILE FRACTURE, URETHROPLASTY, FLEXIBLE CYSTOSCOPY (N/A) URETHROPLASTY    No orders to display    Pertinent Labs:  Cardiac Labs: No results for input(s): CK, CKMB, CTNI, BNP in the last 168 hours. CBC: Recent Labs    Units 07/08/24 1108 07/06/24 0422  WBC thou/mcL 5.3 17.7*  HGB gm/dL 86.6* 87.8*  PLT thou/mcL 211 187   BMP: Recent Labs    Units 07/08/24 1108 07/06/24 0422 07/05/24 2134  NA mmol/L 136 135*  --   K mmol/L 3.8 4.1  --   CL mmol/L 104 102  --   CO2 mmol/L 22 22  --   BUN mg/dL 10 15  --   CREATININE mg/dL 9.19 8.96 8.82   Lipid Panel: No results for input(s): CHOL, TRIG, HDL, LDL in the last 168 hours. Liver Enzymes: No results for input(s): INR, AST, ALT, ALKPHOS, BILITOT in the last 168 hours. Endocrine Panels: Recent Labs    Units 07/08/24 1108 07/06/24 0422  GLUCOSE mg/dL 894* [facility] Course   Physicians involved in care during this hospitalization Attending Provider:  Jerilynn Prentice Sizer, MD Admitting Provider: Jerilynn Prentice Sizer, MD Anesthesiologist: Thedora ONEIDA Kitty, MD Anesthesiologist: Elsa ELINORE Familia, MD Anesthesiologist: Ozell ONEIDA Scala, MD  HPI per admitting provider: [facility] Course:    No notes on file as per notes from  [facility] triage- Pt was having intercourse, 40 minutes ago, pt reports that his penis bent, pt reports penis is swollen, bleeding and painful.  Pt is unable to void.  At the time of visit, patient is in extreme pain, reports bladder is full and have not able to void since the time of injury. bladder scan reveals 237 ml of urine. patient reported that he felt a pop in the penis followed by  flaccid with acute penile pain. On physical exm - penis is swollen,ecchymotic and curved (looks like eggplant deformity) along with bilateral swelling of the scrotum.No bleeding  noted.  Patient underwent repair of penile fracture with complete resection the urethra. Course was uncomplicated, was noted to have some drainage from incision site likely from seroma which is improving with mummy wrap.   Follow up in the office in 3 weeks, keep foley in place.   BP 134/79 (BP Location: Right Upper Arm, Patient Position: Sitting)   Pulse 62   Temp 98.7 F (37.1 C) (Oral)   Resp 16   Ht 5' 7 (1.702 m)   Wt 220 lb (99.8 kg)   SpO2 98%  BMI 34.46 kg/m   Physical Exam Vitals and nursing note reviewed.  Constitutional:      Appearance: Normal appearance.  Neurological:     Mental Status: He is alert.  Psychiatric:        Mood and Affect: Mood normal.        Behavior: Behavior normal.     Post Hospital Care   Activity:   Weight Bearing Status:          Oxygen Orders for Discharge: O2 Device: None (Room air) SpO2: 98 %  Diet: Diet and Nourishment Orders (From admission, onward)     Start       07/05/24 2039  Regular Diet  Diet effective now                   Wound Care Recommendations: Wound/Incision care as  follows: No baths or pools, keep incision site dry   Lines/Drains/Airways: Patient Lines/Drains/Airways Status     Active LDAs     Name Placement date Placement time Site Days   Peripheral IV 20 G Anterior;Right Forearm 07/08/24  2314  Forearm  less than 1   Urethral Catheter Non-latex 07/05/24  1839  Non-latex  3            Therapy Recommendations:  PT:                OT:          SLP:              Home Health Orders: DME Orders (From admission, onward)    None      Home Health Agency     None       I spent  minutes performing discharge services.   Electronically signed: Sharene JINNY Lesches, PA-C 07/09/2024 / 9:25 AM       [1] No Known Allergies

## 2024-07-09 NOTE — Care Plan (Signed)
  Problem: Deep Venous Thrombosis, Risk of Goal: Absence of deep venous thrombosis Outcome: Adequate for Discharge   Problem: Discharge Planning Goal: Knowledge of medical problems (What is my main problem?) 07/09/2024 0959 by Kermit Bring, RN BSN Outcome: Adequate for Discharge 07/09/2024 0913 by Kermit Bring, RN BSN Outcome: Progressing Goal: Knowledge of self care (What do I need to do when I go home?) Outcome: Adequate for Discharge Goal: Knowledge of treatment plan (Why is it important for me to do this?) 07/09/2024 0959 by Kermit Bring, RN BSN Outcome: Adequate for Discharge 07/09/2024 0913 by Kermit Bring, RN BSN Outcome: Progressing Goal: Knowledge of medication management 07/09/2024 0959 by Kermit Bring, RN BSN Outcome: Adequate for Discharge 07/09/2024 0913 by Kermit Bring, RN BSN Outcome: Progressing   Problem: Injury Risk, Abnormal Glucose Level Goal: Glucose level within specified parameters Outcome: Adequate for Discharge   Problem: Sensory Perception - Impaired Goal: Absence of physical injury 07/09/2024 0959 by Kermit Bring, RN BSN Outcome: Adequate for Discharge 07/09/2024 0913 by Kermit Bring, RN BSN Outcome: Progressing

## 2024-07-15 ENCOUNTER — Emergency Department (HOSPITAL_COMMUNITY)
Admission: EM | Admit: 2024-07-15 | Discharge: 2024-07-16 | Disposition: A | Attending: Emergency Medicine | Admitting: Emergency Medicine

## 2024-07-15 DIAGNOSIS — Z4889 Encounter for other specified surgical aftercare: Secondary | ICD-10-CM

## 2024-07-15 DIAGNOSIS — N4829 Other inflammatory disorders of penis: Secondary | ICD-10-CM | POA: Diagnosis present

## 2024-07-15 DIAGNOSIS — Z48817 Encounter for surgical aftercare following surgery on the skin and subcutaneous tissue: Secondary | ICD-10-CM | POA: Insufficient documentation

## 2024-07-16 ENCOUNTER — Other Ambulatory Visit: Payer: Self-pay

## 2024-07-16 ENCOUNTER — Encounter (HOSPITAL_COMMUNITY): Payer: Self-pay

## 2024-07-16 NOTE — ED Provider Notes (Signed)
 [place] EMERGENCY DEPARTMENT AT [facility] Provider Note   CSN: [id number] Arrival date & time: 07/15/24  2329     Patient presents with: Post-op Problem   Alan James is a 43 y.o. male.   HPI     This is a 43 year old male who presents with concern for wound check.  Patient had a fracture of his corpus callosum on 11/12.  He was transferred to [facility] where he underwent surgery.  He has a Foley catheter in place.  Patient states that on Thursday he noted some drainage from the incision site and the underside of his penis.  He called his urologist and was given Bactrim.  He has been taking it since that time.  Yesterday morning he woke up and noted some increased pain at the incision site.  He noted that it appeared that part of the wound opened up.  He has noted some drainage but no pus.  No fevers.  No systemic symptoms.  States that the Foley catheter is draining appropriately.  Prior to Admission medications   Medication Sig Start Date End Date Taking? Authorizing Provider  acetaminophen  (TYLENOL ) 500 MG tablet Take by mouth every 6 (six) hours as needed.    [provider]  benzonatate  (TESSALON ) 200 MG capsule Take 1 capsule (200 mg total) by mouth 3 (three) times daily as needed for cough. Swallow whole, do not chew 11/03/21   Triplett, Tammy, PA-C  ibuprofen  (ADVIL ) 800 MG tablet Take 1 tablet (800 mg total) by mouth every 8 (eight) hours as needed. 07/10/21   Margrette Taft BRAVO, MD  ondansetron  (ZOFRAN -ODT) 4 MG disintegrating tablet Take 1 tablet (4 mg total) by mouth every 8 (eight) hours as needed. 06/16/24   Franklyn Sid SAILOR, MD  predniSONE  (DELTASONE ) 20 MG tablet Take 2 tablets (40 mg total) by mouth daily. 11/03/21   Triplett, Tammy, PA-C    Allergies: Amoxicillin    Review of Systems  Constitutional:  Negative for fever.  Genitourinary:  Positive for penile swelling. Negative for difficulty urinating.  Skin:  Positive for  wound.  All other systems reviewed and are negative.   Updated Vital Signs BP 117/68   Pulse 67   Temp 98.2 F (36.8 C) (Oral)   Resp 18   Ht 1.702 m (5' 7)   Wt 93 kg   SpO2 96%   BMI 32.11 kg/m   Physical Exam Vitals and nursing note reviewed. Exam conducted with a chaperone present.  Constitutional:      Appearance: He is well-developed. He is not ill-appearing.  HENT:     Head: Normocephalic and atraumatic.  Eyes:     Pupils: Pupils are equal, round, and reactive to light.  Cardiovascular:     Rate and Rhythm: Normal rate and regular rhythm.  Pulmonary:     Effort: Pulmonary effort is normal. No respiratory distress.  Abdominal:     Palpations: Abdomen is soft.     Tenderness: There is no abdominal tenderness.  Genitourinary:    Comments: Examination of the penis reveals Foley catheter in place, generalized swelling noted of the penile shaft and foreskin, dorsal incision approximately 5 cm, there is a slight dehiscence at the base of the wound, no active drainage, no adjacent erythema, no fluctuance crepitus Musculoskeletal:     Cervical back: Neck supple.  Lymphadenopathy:     Cervical: No cervical adenopathy.  Skin:    General: Skin is warm and dry.  Neurological:  Mental Status: He is alert and oriented to person, place, and time.  Psychiatric:        Mood and Affect: Mood normal.     (all labs ordered are listed, but only abnormal results are displayed) Labs Reviewed - No data to display  EKG: None  Radiology: No results found.   Procedures   Medications Ordered in the ED - No data to display                                  Medical Decision Making  This patient presents to the ED for concern of wound check, this involves an extensive number of treatment options, and is a complaint that carries with it a high risk of complications and morbidity.  I considered the following differential and admission for this acute, potentially life  threatening condition.  The differential diagnosis includes infection, wound dehiscence, abscess  MDM:    This is a 43 year old male who presents with concern for postoperative wound check.  He is nontoxic and vital signs are reassuring.  It does appear that the sutures on the base of the wound have failed and he has a slight dehiscence.  There is no active drainage or purulence noted.  There is no adjacent erythema to suggest infection.  He is currently on Bactrim.  Of note, Foley catheter positioned and anchored at the patient's right hip.  Initially he was discharged home with a belt to keep his penile shaft elevated.  However now it is a normal lie.  Anchoring for the Foley was repositioned to his thigh for comfort and he was provided with a leg bag.  Recommend that he continue his antibiotic.  Recommend wet-to-dry dressings which she was instructed on how to do by the nursing staff.  Follow-up with urology.  (Labs, imaging, consults)  Labs: I Ordered, and personally interpreted labs.  The pertinent results include: N/A  Imaging Studies ordered: I ordered imaging studies including N/A I independently visualized and interpreted imaging. I agree with the radiologist interpretation  Additional history obtained from chart review.  External records from outside source obtained and reviewed including prior evaluations  Cardiac Monitoring: The patient was not maintained on a cardiac monitor.  If on the cardiac monitor, I personally viewed and interpreted the cardiac monitored which showed an underlying rhythm of: N/A  Reevaluation: After the interventions noted above, I reevaluated the patient and found that they have :stayed the same  Social Determinants of Health:  lives independently  Disposition: Discharge  Co morbidities that complicate the patient evaluation No past medical history on file.   Medicines No orders of the defined types were placed in this encounter.   I have  reviewed the patients home medicines and have made adjustments as needed  Problem List / ED Course: Problem List Items Addressed This Visit   None Visit Diagnoses       Encounter for postoperative wound check    -  Primary                Final diagnoses:  Encounter for postoperative wound check    ED Discharge Orders     None          Elexus Barman, Charmaine FALCON, MD 07/16/24 [phone]

## 2024-07-16 NOTE — ED Triage Notes (Signed)
 Pt c/o surgery incision opened and wants it to be checked. Pt is on 20th bactrim DS from surgeon since Thurs when it first opened. Surgery done 07/06/24.

## 2024-07-16 NOTE — Discharge Instructions (Addendum)
 You were seen today for concerns for wound check.  You do have a small portion of the incision that seems to have separated.  Continue dressing changes.  There are no overt signs of infection at this time.  Continue antibiotics per your urologist.  If you note any new or worsening symptoms, you should follow-up closely.  Your Foley catheter was repositioned and you was provided with a leg bag.

## 2024-07-16 NOTE — ED Notes (Signed)
 Wet to dry dressing applied and leg bag attached.
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
## 2024-08-03 ENCOUNTER — Inpatient Hospital Stay (HOSPITAL_COMMUNITY)
Admission: EM | Admit: 2024-08-03 | Discharge: 2024-08-04 | DRG: 698 | Discharge status: Against Medical Advice | Attending: Internal Medicine | Admitting: Internal Medicine

## 2024-08-03 ENCOUNTER — Other Ambulatory Visit: Payer: Self-pay

## 2024-08-03 ENCOUNTER — Emergency Department (HOSPITAL_COMMUNITY)

## 2024-08-03 ENCOUNTER — Encounter (HOSPITAL_COMMUNITY): Payer: Self-pay

## 2024-08-03 DIAGNOSIS — N390 Urinary tract infection, site not specified: Secondary | ICD-10-CM | POA: Diagnosis present

## 2024-08-03 DIAGNOSIS — F17210 Nicotine dependence, cigarettes, uncomplicated: Secondary | ICD-10-CM | POA: Diagnosis present

## 2024-08-03 DIAGNOSIS — Z5320 Procedure and treatment not carried out because of patient's decision for unspecified reasons: Secondary | ICD-10-CM | POA: Diagnosis present

## 2024-08-03 DIAGNOSIS — A419 Sepsis, unspecified organism: Secondary | ICD-10-CM | POA: Diagnosis not present

## 2024-08-03 DIAGNOSIS — Z880 Allergy status to penicillin: Secondary | ICD-10-CM | POA: Diagnosis not present

## 2024-08-03 DIAGNOSIS — T83511A Infection and inflammatory reaction due to indwelling urethral catheter, initial encounter: Secondary | ICD-10-CM | POA: Diagnosis present

## 2024-08-03 LAB — CBC WITH DIFFERENTIAL/PLATELET
Abs Immature Granulocytes: 0.14 K/uL — ABNORMAL HIGH (ref 0.00–0.07)
Basophils Absolute: 0 K/uL (ref 0.0–0.1)
Basophils Relative: 0 %
Eosinophils Absolute: 0 K/uL (ref 0.0–0.5)
Eosinophils Relative: 0 %
HCT: 42.4 % (ref 39.0–52.0)
Hemoglobin: 14.8 g/dL (ref 13.0–17.0)
Immature Granulocytes: 1 %
Lymphocytes Relative: 11 %
Lymphs Abs: 2.3 K/uL (ref 0.7–4.0)
MCH: 33.9 pg (ref 26.0–34.0)
MCHC: 34.9 g/dL (ref 30.0–36.0)
MCV: 97.2 fL (ref 80.0–100.0)
Monocytes Absolute: 1.7 K/uL — ABNORMAL HIGH (ref 0.1–1.0)
Monocytes Relative: 8 %
Neutro Abs: 18 K/uL — ABNORMAL HIGH (ref 1.7–7.7)
Neutrophils Relative %: 80 %
Platelets: 209 K/uL (ref 150–400)
RBC: 4.36 MIL/uL (ref 4.22–5.81)
RDW: 12.7 % (ref 11.5–15.5)
WBC: 22.2 K/uL — ABNORMAL HIGH (ref 4.0–10.5)
nRBC: 0 % (ref 0.0–0.2)

## 2024-08-03 LAB — COMPREHENSIVE METABOLIC PANEL WITH GFR
ALT: 26 U/L (ref 0–44)
AST: 34 U/L (ref 15–41)
Albumin: 4.3 g/dL (ref 3.5–5.0)
Alkaline Phosphatase: 78 U/L (ref 38–126)
Anion gap: 15 (ref 5–15)
BUN: 12 mg/dL (ref 6–20)
CO2: 18 mmol/L — ABNORMAL LOW (ref 22–32)
Calcium: 9.2 mg/dL (ref 8.9–10.3)
Chloride: 99 mmol/L (ref 98–111)
Creatinine, Ser: 0.98 mg/dL (ref 0.61–1.24)
GFR, Estimated: >60 mL/min (ref >60)
Glucose, Bld: 104 mg/dL — ABNORMAL HIGH (ref 70–99)
Potassium: 3.5 mmol/L (ref 3.5–5.1)
Sodium: 133 mmol/L — ABNORMAL LOW (ref 135–145)
Total Bilirubin: 1 mg/dL (ref 0.0–1.2)
Total Protein: 8 g/dL (ref 6.5–8.1)

## 2024-08-03 LAB — PROTIME-INR
INR: 1.1 (ref 0.8–1.2)
Prothrombin Time: 14.7 s (ref 11.4–15.2)

## 2024-08-03 LAB — TROPONIN T, HIGH SENSITIVITY
Troponin T High Sensitivity: <15 ng/L (ref 0–19)
Troponin T High Sensitivity: <15 ng/L (ref 0–19)

## 2024-08-03 LAB — URINALYSIS, W/ REFLEX TO CULTURE (INFECTION SUSPECTED)
Bilirubin Urine: NEGATIVE
Glucose, UA: NEGATIVE mg/dL
Ketones, ur: NEGATIVE mg/dL
Nitrite: NEGATIVE
Protein, ur: NEGATIVE mg/dL
Specific Gravity, Urine: 1.015 (ref 1.005–1.030)
pH: 5 (ref 5.0–8.0)

## 2024-08-03 LAB — LACTIC ACID, PLASMA
Lactic Acid, Venous: 0.8 mmol/L (ref 0.5–1.9)
Lactic Acid, Venous: 1.1 mmol/L (ref 0.5–1.9)

## 2024-08-03 MED ORDER — ACETAMINOPHEN 650 MG RE SUPP: 650.0000 mg | Freq: Four times a day (QID) | RECTAL | Status: DC | PRN

## 2024-08-03 MED ORDER — LACTATED RINGERS IV BOLUS (SEPSIS)
1000.0000 mL | Freq: Once | INTRAVENOUS | Status: AC
  Administered 2024-08-03: 1000 mL via INTRAVENOUS

## 2024-08-03 MED ORDER — SODIUM CHLORIDE 0.9 % IV SOLN: 2.0000 g | Freq: Three times a day (TID) | INTRAVENOUS | Status: DC

## 2024-08-03 MED ORDER — SODIUM CHLORIDE 0.9 % IV SOLN
2.0000 g | Freq: Once | INTRAVENOUS | Status: AC
  Administered 2024-08-03: 2 g via INTRAVENOUS
  Filled 2024-08-03: qty 10

## 2024-08-03 MED ORDER — LACTATED RINGERS IV SOLN: INTRAVENOUS | Status: AC

## 2024-08-03 MED ORDER — CALCIUM CARBONATE ANTACID 500 MG PO CHEW: 1.0000 | CHEWABLE_TABLET | Freq: Two times a day (BID) | ORAL | Status: DC | PRN

## 2024-08-03 MED ORDER — ACETAMINOPHEN 325 MG PO TABS: 650.0000 mg | ORAL_TABLET | Freq: Four times a day (QID) | ORAL | Status: DC | PRN

## 2024-08-03 MED ORDER — LACTATED RINGERS IV SOLN: INTRAVENOUS | Status: DC

## 2024-08-03 MED ORDER — ACETAMINOPHEN 325 MG PO TABS
650.0000 mg | ORAL_TABLET | Freq: Once | ORAL | Status: AC
  Administered 2024-08-03: 650 mg via ORAL
  Filled 2024-08-03: qty 2

## 2024-08-03 MED ORDER — ENOXAPARIN SODIUM 40 MG/0.4ML IJ SOSY
40.0000 mg | PREFILLED_SYRINGE | INTRAMUSCULAR | Status: DC
  Administered 2024-08-03: 40 mg via SUBCUTANEOUS
  Filled 2024-08-03: qty 0.4

## 2024-08-03 MED ORDER — POLYETHYLENE GLYCOL 3350 17 G PO PACK: 17.0000 g | PACK | Freq: Every day | ORAL | Status: DC | PRN

## 2024-08-03 MED ORDER — ONDANSETRON HCL 4 MG/2ML IJ SOLN: 4.0000 mg | Freq: Four times a day (QID) | INTRAMUSCULAR | Status: DC | PRN

## 2024-08-03 MED ORDER — SODIUM CHLORIDE 0.9 % IV SOLN
2.0000 g | INTRAVENOUS | Status: DC
  Administered 2024-08-04: 2 g via INTRAVENOUS
  Filled 2024-08-03: qty 20

## 2024-08-03 MED ORDER — ONDANSETRON HCL 4 MG PO TABS: 4.0000 mg | ORAL_TABLET | Freq: Four times a day (QID) | ORAL | Status: DC | PRN

## 2024-08-03 MED ADMIN — Pantoprazole Sodium EC Tab 40 MG (Base Equiv): 40 mg | ORAL | NDC 00904647461

## 2024-08-03 MED FILL — Pantoprazole Sodium EC Tab 40 MG (Base Equiv): 40.0000 mg | ORAL | Qty: 1 | Status: AC
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
## 2024-08-03 NOTE — ED Provider Notes (Signed)
 [place] EMERGENCY DEPARTMENT AT [facility] Provider Note   CSN: [id number] Arrival date & time: 08/03/24  1614     Patient presents with: Urinary Tract Infection   Alan James is a 43 y.o. male.    Urinary Tract Infection Associated symptoms: fever        Alan James is a 43 y.o. male who underwent surgical repair of a fractured corpus cavernosum and urethral disruption at [facility] on 07/05/2024.  He has Foley catheter in place since his surgery.  He has been having urology follow-up and was seen 2 days ago by his urologist.  He states he had some manipulation of his penis at that time and the following day developed fever and chills.  States that he is having some pus around the tip of his penis and feels that his catheter is not draining as well as previous.  Has noticed some dark-colored urine.  Denies any nausea or vomiting.  Took Tylenol  earlier today.  States that he was called from his urologist office and advised to come to the emergency department for further evaluation.  Denies any chest pain shortness of breath or abdominal pain.  Prior to Admission medications  Medication Sig Start Date End Date Taking? Authorizing Provider  acetaminophen  (TYLENOL ) 500 MG tablet Take 1,000 mg by mouth every 6 (six) hours as needed for fever.   Yes [provider]  calcium  carbonate (TUMS - DOSED IN MG ELEMENTAL CALCIUM ) 500 MG chewable tablet Chew 1 tablet by mouth as needed for indigestion or heartburn.   Yes [provider]  naproxen  sodium (ALEVE ) 220 MG tablet Take 220 mg by mouth daily as needed (pain).   Yes [provider]  cephALEXin (KEFLEX) 250 MG capsule Take 250 mg by mouth daily. Patient not taking: Reported on 08/03/2024 07/09/24   [provider]  oxyCODONE  (OXY IR/ROXICODONE ) 5 MG immediate release tablet Take 5 mg by mouth 4 (four) times daily as needed. Patient not taking: Reported on 08/03/2024 07/09/24    [provider]  sulfamethoxazole-trimethoprim (BACTRIM DS) 800-160 MG tablet Take 1 tablet by mouth 2 (two) times daily. Patient not taking: Reported on 08/03/2024 07/14/24   [provider]    Allergies: Amoxicillin    Review of Systems  Constitutional:  Positive for chills and fever.  Genitourinary:        Foley catheter issue  All other systems reviewed and are negative.   Updated Vital Signs BP 122/64   Pulse 95   Temp 99.4 F (37.4 C) (Oral)   Resp 16   Ht 5' 7 (1.702 m)   Wt 95.3 kg   SpO2 95%   BMI 32.89 kg/m   Physical Exam Vitals and nursing note reviewed.  Constitutional:      General: He is not in acute distress.    Appearance: He is not toxic-appearing.  HENT:     Mouth/Throat:     Mouth: Mucous membranes are moist.  Cardiovascular:     Rate and Rhythm: Regular rhythm. Tachycardia present.     Pulses: Normal pulses.  Pulmonary:     Effort: Pulmonary effort is normal.     Breath sounds: Normal breath sounds.  Abdominal:     Palpations: Abdomen is soft.     Tenderness: There is no abdominal tenderness. There is no right CVA tenderness or left CVA tenderness.  Musculoskeletal:        General: Normal range of motion.  Right lower leg: No edema.     Left lower leg: No edema.  Skin:    General: Skin is warm.     Capillary Refill: Capillary refill takes less than 2 seconds.  Neurological:     General: No focal deficit present.     Mental Status: He is alert.     Sensory: No sensory deficit.     Motor: No weakness.     (all labs ordered are listed, but only abnormal results are displayed) Labs Reviewed  COMPREHENSIVE METABOLIC PANEL WITH GFR - Abnormal; Notable for the following components:      Result Value   Sodium 133 (*)    CO2 18 (*)    Glucose, Bld 104 (*)    All other components within normal limits  CBC WITH DIFFERENTIAL/PLATELET - Abnormal; Notable for the following components:   WBC 22.2 (*)    Neutro Abs 18.0  (*)    Monocytes Absolute 1.7 (*)    Abs Immature Granulocytes 0.14 (*)    All other components within normal limits  CULTURE, BLOOD (ROUTINE X 2)  CULTURE, BLOOD (ROUTINE X 2)  LACTIC ACID, PLASMA  LACTIC ACID, PLASMA  PROTIME-INR  URINALYSIS, W/ REFLEX TO CULTURE (INFECTION SUSPECTED)  TROPONIN T, HIGH SENSITIVITY  TROPONIN T, HIGH SENSITIVITY    EKG: EKG Interpretation Date/Time:  Thursday August 03 2024 16:33:28 EST Ventricular Rate:  113 PR Interval:  140 QRS Duration:  84 QT Interval:  296 QTC Calculation: 406 R Axis:   103  Text Interpretation: Sinus tachycardia Rightward axis Nonspecific ST abnormality Compared with prior EKG from 06/16/2024 Confirmed by Gennaro Bouchard ([id number]) on 08/03/2024 5:22:25 PM  Radiology: DG Chest 2 View Result Date: 08/03/2024 CLINICAL DATA:  Fever.  Concern for infection. EXAM: CHEST - 2 VIEW COMPARISON:  Chest radiograph dated 06/16/2024. FINDINGS: The heart size and mediastinal contours are within normal limits. Both lungs are clear. The visualized skeletal structures are unremarkable. IMPRESSION: No active cardiopulmonary disease. Electronically Signed   By: Vanetta Chou M.D.   On: 08/03/2024 17:16     Procedures     CRITICAL CARE Performed by: Friedrich Harriott Total critical care time: 40 minutes Critical care time was exclusive of separately billable procedures and treating other patients. Critical care was necessary to treat or prevent imminent or life-threatening deterioration. Critical care was time spent personally by me on the following activities: development of treatment plan with patient and/or surrogate as well as nursing, discussions with consultants, evaluation of patient's response to treatment, examination of patient, obtaining history from patient or surrogate, ordering and performing treatments and interventions, ordering and review of laboratory studies, ordering and review of radiographic studies, pulse oximetry  and re-evaluation of patient's condition.  Medications Ordered in the ED  lactated ringers  infusion ( Intravenous New Bag/Given 08/03/24 1939)  lactated ringers  bolus 1,000 mL (0 mLs Intravenous Stopped 08/03/24 1844)    And  lactated ringers  bolus 1,000 mL (0 mLs Intravenous Stopped 08/03/24 1844)    And  lactated ringers  bolus 1,000 mL (1,000 mLs Intravenous New Bag/Given 08/03/24 1925)  aztreonam  (AZACTAM ) 2 g in sodium chloride  0.9 % 100 mL IVPB (0 g Intravenous Stopped 08/03/24 1842)  acetaminophen  (TYLENOL ) tablet 650 mg (650 mg Oral Given 08/03/24 1824)                                    Medical Decision Making  Patient here from home for evaluation of fever chills and concerns for problem with his Foley catheter.  He underwent surgery for fracture of the corpus cavernosum at [facility] in mid November.  He had a urology follow-up 2 days ago and endorses some manipulation of his penis and since then has been having problems with his Foley catheter concerned that it is not draining properly and he has noticed some pus around the insertion site at the tip of his penis.  Fever began yesterday.  On my exam, patient appears hemodynamically stable.  He is febrile here as well as tachycardic.  Likely developing sepsis secondary to urinary tract infection.  Obstructive uropathy also considered the patient does not have any abdominal pain or flank pain.  Amount and/or Complexity of Data Reviewed Labs: ordered.    Details: Labs significant leukocytosis with white count of greater than 22,000.  Chemistries without significant derangement.  Lactic acid unremarkable.  Urinalysis from catheterized sample shows large amount of blood small leukocytes 21-50 red cells and 11-20 white cells with rare bacteria.  Urine and blood cultures are pending Radiology: ordered.    Details: Chest x-ray without acute cardiopulmonary process ECG/medicine tests: ordered.    Details: EKG shows sinus  tachycardia Discussion of management or test interpretation with external provider(s):  Discussed with urology, Dr. Nieves.  Recommends bladder scan and irrigate Foley catheter if Foley flushes and bladder scan without significant retention, he felt that patient would be appropriate to stay at [facility] for treatment of his sepsis.  Recommends transfer to [facility] if he develops complications with his Foley catheter.  Per nursing report, catheter flushes without difficulty and bladder scan showed 30 cc  Discussed with [facility] hospitalist, Dr. Pearlean who agrees to admit  Risk OTC drugs. Prescription drug management. Decision regarding hospitalization.        Final diagnoses:  Sepsis secondary to UTI [facility])    ED Discharge Orders     None          Herlinda Milling, PA-C 08/03/24 2336    Gennaro Bouchard L, DO 08/08/24 [phone]

## 2024-08-03 NOTE — ED Notes (Signed)
 Upon head to toe assessment pt was Aox4 for this RN.

## 2024-08-03 NOTE — ED Triage Notes (Signed)
 Pt arrived via POV c/o fever, white pus like drainage from his catheter and reports he was supposed to have the catheter removed 2 days ago, but was unable to have it removed by his doctor. Pt endorses generalized pain as well and presents with a fever of 103. 12F in Triage after Pt reports taking Tylenol  earlier today PTA.

## 2024-08-04 LAB — BASIC METABOLIC PANEL WITH GFR
Anion gap: 10 (ref 5–15)
BUN: 8 mg/dL (ref 6–20)
CO2: 25 mmol/L (ref 22–32)
Calcium: 8.5 mg/dL — ABNORMAL LOW (ref 8.9–10.3)
Chloride: 100 mmol/L (ref 98–111)
Creatinine, Ser: 0.97 mg/dL (ref 0.61–1.24)
GFR, Estimated: >60 mL/min (ref >60)
Glucose, Bld: 65 mg/dL — ABNORMAL LOW (ref 70–99)
Potassium: 3.7 mmol/L (ref 3.5–5.1)
Sodium: 136 mmol/L (ref 135–145)

## 2024-08-04 LAB — CBC
HCT: 37.7 % — ABNORMAL LOW (ref 39.0–52.0)
Hemoglobin: 13 g/dL (ref 13.0–17.0)
MCH: 34.1 pg — ABNORMAL HIGH (ref 26.0–34.0)
MCHC: 34.5 g/dL (ref 30.0–36.0)
MCV: 99 fL (ref 80.0–100.0)
Platelets: 175 K/uL (ref 150–400)
RBC: 3.81 MIL/uL — ABNORMAL LOW (ref 4.22–5.81)
RDW: 12.9 % (ref 11.5–15.5)
WBC: 19.1 K/uL — ABNORMAL HIGH (ref 4.0–10.5)
nRBC: 0 % (ref 0.0–0.2)

## 2024-08-04 LAB — HIV ANTIBODY (ROUTINE TESTING W REFLEX): HIV Screen 4th Generation wRfx: NONREACTIVE

## 2024-08-04 MED ORDER — CHLORHEXIDINE GLUCONATE CLOTH 2 % EX PADS
6.0000 | MEDICATED_PAD | Freq: Every day | CUTANEOUS | Status: DC
  Administered 2024-08-04: 6 via TOPICAL

## 2024-08-04 MED ADMIN — Pantoprazole Sodium EC Tab 40 MG (Base Equiv): 40 mg | ORAL | NDC 00904647461
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
## 2024-08-04 NOTE — Plan of Care (Signed)
   Problem: Education: Goal: Knowledge of General Education information will improve Description: Including pain rating scale, medication(s)/side effects and non-pharmacologic comfort measures Outcome: Progressing   Problem: Clinical Measurements: Goal: Will remain free from infection Outcome: Progressing Goal: Respiratory complications will improve Outcome: Progressing

## 2024-08-04 NOTE — TOC CM/SW Note (Signed)
 Transition of Care [facility]) - Inpatient Brief Assessment   Patient Details  Name: Alan James MRN: [id number] Date of Birth: [birth year]  Transition of Care [facility]) CM/SW Contact:    Noreen KATHEE Pinal, LCSWA Phone Number: 08/04/2024, 7:59 AM   Clinical Narrative:  Inpatient Care Management (ICM) has reviewed patient and no other ICM needs have been identified at this time. We will continue to monitor patient advancement through interdisciplinary progression rounds. If new patient transition needs arise, please place a ICM consult.  Transition of Care Asessment: Insurance and Status: Insurance coverage has been reviewed Patient has primary care physician: Yes Home environment has been reviewed: From Home Prior level of function:: Independent Prior/Current Home Services: No current home services Social Drivers of Health Review: SDOH reviewed interventions complete Readmission risk has been reviewed: Yes Transition of care needs: no transition of care needs at this time

## 2024-08-04 NOTE — Progress Notes (Signed)
 Patient called out to the desk he wanted to see his nurse. This was approximately 1930. I was assigning patients and went in his room around 1935. He was sitting on the side of the bed. He had already put his clothes on, taken his telemetry leads off, and told me he wanted to leave. He stated that he had never had a doctor laugh in his face and he was not going to stay here. He looks at me and his eyes are watery and red.  I ask him to give me time to reach out to the NP.  I asked if he would stay long enough for the NP to come up and talk to him, that she would be 10 minutes. He stated no that his ride is on the way and he is leaving. I had him sign the paper work and took out his IV's.   I was contacted back by the NP that they were going to call in an antibiotic to the pharmacy but he had already left and I was unable to reach him by phone. I tried reaching his emergency contact but was unsuccessful in contacting her as well.   NP notified.

## 2024-08-04 NOTE — Progress Notes (Addendum)
° ° °  Against Medical Advice   Alan James expresses desire to leave the Hospital immediately. Patient has been warned that this is not medically advisable at this time, and can result in medical complications like Death and Disability. Patient understands and accepts the risks involved and assumes full responsibilty of this decision.  This patient has also been advised that if they feel the need for further medical assistance to return to any available ER or dial [phone].  Informed by Nursing staff that this patient has left care and has signed the form  Against Medical Advice on 08/04/2024 at 1945.  Reached out to urologist to inform them of patients choice of leaving AMA and was given the Okay to prescribe Levaquin for 10 days PO outpatient with strict return precautions if  worsening of symptoms.  Unable to reach patient on the phone to secure outpatient pharmacy.

## 2024-08-04 NOTE — ED Notes (Signed)
 Pts foley flushed with no complications.
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
## 2024-08-06 LAB — URINE CULTURE: Culture: 80000 — AB

## 2024-08-08 LAB — CULTURE, BLOOD (ROUTINE X 2)
Culture: NO GROWTH
Culture: NO GROWTH
Special Requests: ADEQUATE
Special Requests: ADEQUATE
